# Patient Record
Sex: Female | Born: 1990 | Race: White | Hispanic: No | Marital: Married | Smoking: Former smoker
Health system: Southern US, Community
[De-identification: ages and names within clinical notes are randomized; demographics above are authoritative.]

## PROBLEM LIST (undated history)

## (undated) DIAGNOSIS — F419 Anxiety disorder, unspecified: Secondary | ICD-10-CM

## (undated) DIAGNOSIS — F32A Depression, unspecified: Secondary | ICD-10-CM

## (undated) DIAGNOSIS — R87629 Unspecified abnormal cytological findings in specimens from vagina: Secondary | ICD-10-CM

## (undated) DIAGNOSIS — F329 Major depressive disorder, single episode, unspecified: Secondary | ICD-10-CM

## (undated) HISTORY — PX: MOUTH SURGERY: SHX715

## (undated) HISTORY — DX: Depression, unspecified: F32.A

## (undated) HISTORY — DX: Anxiety disorder, unspecified: F41.9

## (undated) HISTORY — DX: Major depressive disorder, single episode, unspecified: F32.9

## (undated) HISTORY — DX: Unspecified abnormal cytological findings in specimens from vagina: R87.629

---
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
## 2010-02-10 ENCOUNTER — Observation Stay: Payer: Self-pay | Admitting: Obstetrics and Gynecology

## 2010-02-16 ENCOUNTER — Observation Stay: Payer: Self-pay | Admitting: Obstetrics and Gynecology

## 2010-03-01 ENCOUNTER — Inpatient Hospital Stay: Payer: Self-pay | Admitting: Obstetrics and Gynecology

## 2014-03-08 ENCOUNTER — Encounter (HOSPITAL_COMMUNITY): Payer: Self-pay | Admitting: Emergency Medicine

## 2014-03-08 ENCOUNTER — Emergency Department (HOSPITAL_COMMUNITY): Payer: BC Managed Care – HMO

## 2014-03-08 ENCOUNTER — Emergency Department (HOSPITAL_COMMUNITY)
Admission: EM | Admit: 2014-03-08 | Discharge: 2014-03-08 | Disposition: A | Discharge status: Home / Self Care | Payer: BC Managed Care – HMO | Attending: Emergency Medicine | Admitting: Emergency Medicine

## 2014-03-08 DIAGNOSIS — R51 Headache: Secondary | ICD-10-CM | POA: Insufficient documentation

## 2014-03-08 DIAGNOSIS — R509 Fever, unspecified: Secondary | ICD-10-CM | POA: Diagnosis not present

## 2014-03-08 DIAGNOSIS — H9209 Otalgia, unspecified ear: Secondary | ICD-10-CM | POA: Insufficient documentation

## 2014-03-08 DIAGNOSIS — J029 Acute pharyngitis, unspecified: Secondary | ICD-10-CM | POA: Insufficient documentation

## 2014-03-08 LAB — CBC WITH DIFFERENTIAL/PLATELET
BASOS PCT: 0 % (ref 0–1)
Basophils Absolute: 0.1 10*3/uL (ref 0.0–0.1)
EOS ABS: 0 10*3/uL (ref 0.0–0.7)
Eosinophils Relative: 0 % (ref 0–5)
HCT: 39.9 % (ref 36.0–46.0)
Hemoglobin: 13.3 g/dL (ref 12.0–15.0)
Lymphocytes Relative: 11 % — ABNORMAL LOW (ref 12–46)
Lymphs Abs: 1.8 10*3/uL (ref 0.7–4.0)
MCH: 27.9 pg (ref 26.0–34.0)
MCHC: 33.3 g/dL (ref 30.0–36.0)
MCV: 83.8 fL (ref 78.0–100.0)
Monocytes Absolute: 1.5 10*3/uL — ABNORMAL HIGH (ref 0.1–1.0)
Monocytes Relative: 9 % (ref 3–12)
Neutro Abs: 12.9 10*3/uL — ABNORMAL HIGH (ref 1.7–7.7)
Neutrophils Relative %: 80 % — ABNORMAL HIGH (ref 43–77)
PLATELETS: 231 10*3/uL (ref 150–400)
RBC: 4.76 MIL/uL (ref 3.87–5.11)
RDW: 12.5 % (ref 11.5–15.5)
WBC: 16.3 10*3/uL — ABNORMAL HIGH (ref 4.0–10.5)

## 2014-03-08 LAB — RAPID STREP SCREEN (MED CTR MEBANE ONLY): STREPTOCOCCUS, GROUP A SCREEN (DIRECT): NEGATIVE

## 2014-03-08 MED ORDER — IBUPROFEN 800 MG PO TABS
800.0000 mg | ORAL_TABLET | Freq: Once | ORAL | Status: AC
  Administered 2014-03-08: 800 mg via ORAL
  Filled 2014-03-08: qty 1

## 2014-03-08 MED ORDER — ACETAMINOPHEN 325 MG PO TABS
650.0000 mg | ORAL_TABLET | Freq: Four times a day (QID) | ORAL | Status: DC | PRN
  Administered 2014-03-08: 650 mg via ORAL

## 2014-03-08 NOTE — ED Notes (Signed)
Pt states that she was feeling bad one week prior, but got better, only to have flu like symptoms for the past two days.  Pt states she have been having a fever at home with some relief from motrin, but little from tylenol.  Pt states she has been just sleeping all day

## 2014-03-08 NOTE — ED Notes (Addendum)
Pt reports having flu like symptoms for several days. Bodyaches, ear pain, chills, temp, sore throat, feeling lightheaded. Reports similar episode occurred last week but resolved. Mask on pt at triage.

## 2014-03-08 NOTE — Discharge Instructions (Signed)
Fever, Adult Make sure that you drink at least six 8 ounce glasses of water daily. Gatorade is also good to rehydrate. Take Tylenol every 4 hours as needed for discomfort or for temperature higher than 100.4 while awake. Call the wellness Center to get a primary care physician. Seen in urgent care Center or the wellness Center or you can call any of the numbers on the resource guide to be seen if not better in 3 or 4 days A fever is a higher than normal body temperature. In an adult, an oral temperature around 98.6 F (37 C) is considered normal. A temperature of 100.4 F (38 C) or higher is generally considered a fever. Mild or moderate fevers generally have no long-term effects and often do not require treatment. Extreme fever (greater than or equal to 106 F or 41.1 C) can cause seizures. The sweating that may occur with repeated or prolonged fever may cause dehydration. Elderly people can develop confusion during a fever. A measured temperature can vary with:  Age.  Time of day.  Method of measurement (mouth, underarm, rectal, or ear). The fever is confirmed by taking a temperature with a thermometer. Temperatures can be taken different ways. Some methods are accurate and some are not.  An oral temperature is used most commonly. Electronic thermometers are fast and accurate.  An ear temperature will only be accurate if the thermometer is positioned as recommended by the manufacturer.  A rectal temperature is accurate and done for those adults who have a condition where an oral temperature cannot be taken.  An underarm (axillary) temperature is not accurate and not recommended. Fever is a symptom, not a disease.  CAUSES   Infections commonly cause fever.  Some noninfectious causes for fever include:  Some arthritis conditions.  Some thyroid or adrenal gland conditions.  Some immune system conditions.  Some types of cancer.  A medicine reaction.  High doses of certain street  drugs such as methamphetamine.  Dehydration.  Exposure to high outside or room temperatures.  Occasionally, the source of a fever cannot be determined. This is sometimes called a "fever of unknown origin" (FUO).  Some situations may lead to a temporary rise in body temperature that may go away on its own. Examples are:  Childbirth.  Surgery.  Intense exercise. HOME CARE INSTRUCTIONS   Take appropriate medicines for fever. Follow dosing instructions carefully. If you use acetaminophen to reduce the fever, be careful to avoid taking other medicines that also contain acetaminophen. Do not take aspirin for a fever if you are younger than age 23. There is an association with Reye's syndrome. Reye's syndrome is a rare but potentially deadly disease.  If an infection is present and antibiotics have been prescribed, take them as directed. Finish them even if you start to feel better.  Rest as needed.  Maintain an adequate fluid intake. To prevent dehydration during an illness with prolonged or recurrent fever, you may need to drink extra fluid.Drink enough fluids to keep your urine clear or pale yellow.  Sponging or bathing with room temperature water may help reduce body temperature. Do not use ice water or alcohol sponge baths.  Dress comfortably, but do not over-bundle. SEEK MEDICAL CARE IF:   You are unable to keep fluids down.  You develop vomiting or diarrhea.  You are not feeling at least partly better after 3 days.  You develop new symptoms or problems. SEEK IMMEDIATE MEDICAL CARE IF:   You have shortness of breath  or trouble breathing.  You develop excessive weakness.  You are dizzy or you faint.  You are extremely thirsty or you are making little or no urine.  You develop new pain that was not there before (such as in the head, neck, chest, back, or abdomen).  You have persistant vomiting and diarrhea for more than 1 to 2 days.  You develop a stiff neck or your  eyes become sensitive to light.  You develop a skin rash.  You have a fever or persistent symptoms for more than 2 to 3 days.  You have a fever and your symptoms suddenly get worse. MAKE SURE YOU:   Understand these instructions.  Will watch your condition.  Will get help right away if you are not doing well or get worse. Document Released: 02/12/2001 Document Revised: 11/11/2011 Document Reviewed: 06/20/2011 [facility] Patient Information 2015 [place], [place]. This information is not intended to replace advice given to you by your health care provider. Make sure you discuss any questions you have with your health care provider.  Emergency Department Resource Guide 1) Find a Doctor and Pay Out of Pocket Although you won't have to find out who is covered by your insurance plan, it is a good idea to ask around and get recommendations. You will then need to call the office and see if the doctor you have chosen will accept you as a new patient and what types of options they offer for patients who are self-pay. Some doctors offer discounts or will set up payment plans for their patients who do not have insurance, but you will need to ask so you aren't surprised when you get to your appointment.  2) Contact Your Local Health Department Not all health departments have doctors that can see patients for sick visits, but many do, so it is worth a call to see if yours does. If you don't know where your local health department is, you can check in your phone book. The CDC also has a tool to help you locate your state's health department, and many state websites also have listings of all of their local health departments.  3) Find a Walk-in Clinic If your illness is not likely to be very severe or complicated, you may want to try a walk in clinic. These are popping up all over the country in pharmacies, drugstores, and shopping centers. They're usually staffed by nurse practitioners or physician assistants  that have been trained to treat common illnesses and complaints. They're usually fairly quick and inexpensive. However, if you have serious medical issues or chronic medical problems, these are probably not your best option.  No Primary Care Doctor: - Call Health Connect at  [phone] - they can help you locate a primary care doctor that  accepts your insurance, provides certain services, etc. - Physician Referral Service- [phone]  Chronic Pain Problems: Organization         Address  Phone   Notes  [facility]  [phone] Patients need to be referred by their primary care doctor.   Medication Assistance: Organization         Address  Phone   Notes  [facility] [address] [place] [place], [place] [address] [phone] --Must be a resident of [facility] -- Must have NO insurance coverage whatsoever (no Medicaid/ Medicare, etc.) -- The pt. MUST have a primary care doctor that directs their care regularly and follows them in the community  Med[facility]ist  [phone]   [employer]  [phone]    Agencies that provide inexpensive medical care: Organization         Address  Phone   Notes  [facility]  [phone]   [facility]    [phone]   [facility] [address] [place], [place] [address] [phone]   [facility] [address] [place], [place] [phone]   Planned Parenthood    [phone]   [facility]    [phone]   Community Health and [facility]  [address], [place] Phone:  [phone], Fax:  [phone] Hours of Operation:  9 am - 6 pm, M-F.  Also accepts Medicaid/Medicare and self-pay.  [facility]  [address], [place] Phone: [phone], Fax: [phone]. Hours of Operation:  8:30 am - 5:30 pm, M-F.  Also accepts Medicaid  and self-pay.  [facility] [place] [address] [place] Phone: [phone]   [facility] [address] Natasha Bence [place], [place] [phone], Ext. [phone] Mondays & Thursdays: 7-9 AM.  First 15 patients are seen on a first come, first serve basis.    Medicaid-accepting [facility] Providers:  Organization         Address  Phone   Notes  [facility] [address], [place] [phone] Also accepts self-pay patients.  [facility] [address] Laurell Josephs [place], [place]  [phone]   [facility] [address], [place] [phone]   [facility] [address] [place] [phone]   Renaye Rakers [address], [place]   [phone] Only accepts [place] Access [place] patients after they have their name applied to their card.   Self-Pay (no insurance) in [facility]:  Organization         Address  Phone   Notes  Sickle Cell Patients, [facility] [address] [place], [place] [phone]   [facility] [address] [place], [place] [phone]   [facility] [place]  [address], [place] [phone]   [facility]/Dr. Osei-Bonsu  [address] [place] or [address], [place] [phone] Phone number for both [place] and [place] locations is the same.  Urgent Medical and [facility] [address] [place] [phone]   [facility] [address] [place] or [address] [phone]   [facility] [address], [place] [phone], phone; [phone], fax Sees patients 1st and 3rd Saturday of every month.  Must not qualify for public or private insurance (i.e. Medicaid, Medicare, [place] Health Choice, Veterans' Benefits)  Household income  should be no more than 200% of the poverty level The clinic cannot treat you if you are pregnant or think you are pregnant  Sexually transmitted diseases are not treated at the clinic.    Dental Care: Organization         Address  Phone  Notes  [facility] Department of [facility] [address] [place], [place] [phone] Accepts children up to age 79 who are enrolled in [place] or [place] Health Choice;  pregnant women with a Medicaid card; and children who have applied for Medicaid or [place] Health Choice, but were declined, whose parents can pay a reduced fee at time of service.  [facility] [place]  [address], [place] [phone] Accepts children up to age 23 who are enrolled in [place] or [place] Health Choice; pregnant women with a Medicaid card; and children who have applied for Medicaid or [place] Health Choice, but were declined, whose parents can pay a reduced fee at time of service.  [facility]  [address], [place] [phone] Patients are seen by appointment only. Walk-ins are not accepted. [facility] will see patients 23 years of age and older. Monday - Tuesday (8am-5pm) Most Wednesdays (8:30-5pm) $30 per visit, cash only  [facility]  [address], [facility] [place] [phone] Patients are seen by appointment only. Walk-ins are not accepted. [facility] will see patients 23 years of age and older. One Wednesday Evening (Monthly: Volunteer Based).  $30 per visit, cash only  [employer] [facility] [facility]  [phone] for adults; Children under age 134, call Graduate Pediatric Dentistry at [phone]. Children aged 404-14, please call [phone] to request a pediatric application.  Dental services are provided in all areas of dental care including fillings, crowns and bridges, complete and partial dentures, implants, gum treatment,  root canals, and extractions. Preventive care is also provided. Treatment is provided to both adults and children. Patients are selected via a lottery and there is often a waiting list.   [facility] [address], [place]  [phone] [email or web address]   [facility] [address], [place], [place] [phone], Ext. [phone] Second and Fourth Thursday of each month, opens at 6:30 AM; Clinic ends at 9 AM.  Patients are seen on a first-come first-served basis, and a limited number are seen during each clinic.   [facility]  [address], [place], [place] [phone]   Eligibility Requirements You must have lived in [place], [place], or [place] counties for at least the last three months.   You cannot be eligible for state or federal sponsored [place] Cityhealthcare insurance, including CIGNAVeterans Administration, [place]icaid, or [employer]icare.   You generally cannot be eligible for healthcare insurance through your employer.    How to apply: Eligibility screenings are held every Tuesday and Wednesday afternoon from 1:00 pm until 4:00 pm. You do not need an appointment for the interview!  [facility] [address], [place], [place] [phone]   [facility]  [phone]   [facility]  [phone]   [place]  [phone]    Behavioral Health Resources in the Community: Intensive Outpatient Programs Organization         Address  Phone  Notes  [facility] [address], [place], [place] [phone]   [facility] [address], [place], [place] [phone]   [facility] [address], [place], [place]  [phone]   [facility] [address],  [place], [place] [phone] or [phone]   Substance Abuse Resources Organization         Address  Phone  Notes  [facility]   [phone]   [facility]  [phone]   [facility]  [phone]  Floydene Flock  [phone]   Residential & Outpatient Substance Abuse Program  [phone]   Psychological Services Organization         Address  Phone  Notes  [facility]  [phone]   [facility]  [phone]   [facility] [address], [place] [phone] or [phone]    Mobile Crisis Teams Organization         Address  Phone  Notes  [facility], Mobile Crisis Care Unit  [phone]   [facility]  [address] [place], [place] [phone]   Doristine Locks [address] [place] [phone]    Self-Help/Support Groups Organization         Address  Phone             Notes  [facility] [place] - variety of support groups  [phone]- I7437963 Call for more information  Narcotics Anonymous (NA), [facility] [address], [employer] [place]  2 meetings at this location   Statistician         Address  Phone  Notes  [facility] [address],    [place]  [phone]   [facility]  [address], [place] [address], [place], [place] [phone]   [facility] [address] [place], [place] [phone] Admissions: 8am-3pm M-F  [facility] [address],    [place], [place] [phone]   [facility] [address] [place], [place], [place] [phone]   [facility] [address]  [place], [place] [phone]   [facility] - Intensive Outpatient [address] Laurell Josephs [address], [place], [place] [phone]   [facility] ([facility] [address]  [place], [place] [phone] or [phone]   [facility] ([facility]) [address], [place], [place] [phone] Accepts Medicaid  [facility] [address]  [place] [phone] Substance Abuse/Addiction Treatment   [facility] Organization         Address  Phone  Notes  [facility]  [phone]   Angie Fava, PhD [address] Ervin Knack [place], [place]   [phone] or [phone]   [facility]   [address] [place], [place] [phone]   [facility] [address], [place], [place] [phone] Insurance/Medicaid/sponsorship through [facility] [address]                                    [place], [place] [phone] Therapy/tele-psych/case  [facility] [address], [place] [phone]    Dr. Lolly Mustache  [phone]   [facility]  [employer] [facility] Dept. 1) [address] [place] 2) [address], [place] 3)  [address], [place] [phone]  [phone]   [facility] Child Abuse Hotline [phone])002-3970 or [phone] (After Hours)

## 2014-03-08 NOTE — ED Provider Notes (Signed)
CSN: [id number]     Arrival date & time 03/08/14  1458 History   First MD Initiated Contact with Patient 03/08/14 1947     Chief Complaint  Patient presents with  . Fever  . Influenza     (Consider location/radiation/quality/duration/timing/severity/associated sxs/prior Treatment) Patient is a 23 y.o. female presenting with fever and flu symptoms.  Fever Associated symptoms: cough, ear pain, headaches and sore throat   Influenza Presenting symptoms: cough, fever, headache and sore throat   Associated symptoms: ear pain    Complaint of headache right ear pain cough sore throat diffuse myalgias and fever onset 2 days ago. Treated with Motrin at home but transient relief. Pain is worse with swallowing. Moderate present. No other associated symptoms. No vomiting or diarrhea no bowel pain No past medical history on file. No past surgical history on file. past medical history negative surgical history negative No family history on file. History  Substance Use Topics  . Smoking status: Not on file  . Smokeless tobacco: Not on file  . Alcohol Use: No   no tobacco occasional alcohol no illicit drug use OB History   Grav Para Term Preterm Abortions TAB SAB Ect Mult Living                 Review of Systems  Constitutional: Positive for fever.  HENT: Positive for ear pain and sore throat.   Respiratory: Positive for cough.   Neurological: Positive for headaches.      Allergies  Review of patient's allergies indicates no known allergies.  Home Medications   Prior to Admission medications   Not on File   BP 98/67  Pulse 117  Temp(Src) 102.8 F (39.3 C) (Oral)  Resp 22  Ht 5\' 1"  (1.549 m)  Wt 135 lb (61.236 kg)  BMI 25.52 kg/m2  SpO2 100%  LMP 03/01/2014 Physical Exam  Nursing note and vitals reviewed. Constitutional: She appears well-developed and well-nourished. No distress.  HENT:  Head: Normocephalic and atraumatic.  Right Ear: External ear normal.  Left Ear:  External ear normal.  Mouth/Throat: No oropharyngeal exudate.  Oropharynx reddened, uvula midline  Eyes: Conjunctivae are normal. Pupils are equal, round, and reactive to light.  Neck: Neck supple. No tracheal deviation present. No thyromegaly present.  Cardiovascular: Normal rate and regular rhythm.   No murmur heard. Pulmonary/Chest: Effort normal and breath sounds normal.  Abdominal: Soft. Bowel sounds are normal. She exhibits no distension. There is no tenderness.  Musculoskeletal: Normal range of motion. She exhibits no edema and no tenderness.  Neurological: She is alert. Coordination normal.  Skin: Skin is warm and dry. No rash noted.  Psychiatric: She has a normal mood and affect.    ED Course  Procedures (including critical care time) Labs Review Labs Reviewed  CBC WITH DIFFERENTIAL - Abnormal; Notable for the following:    WBC 16.3 (*)    Neutrophils Relative % 80 (*)    Neutro Abs 12.9 (*)    Lymphocytes Relative 11 (*)    Monocytes Absolute 1.5 (*)    All other components within normal limits  RAPID STREP SCREEN  LACTIC ACID, PLASMA  BASIC METABOLIC PANEL  HCG, SERUM, QUALITATIVE  MONONUCLEOSIS SCREEN    Imaging Review Dg Chest 2 View  03/08/2014   CLINICAL DATA:  Fever and chills.  EXAM: CHEST  2 VIEW  COMPARISON:  None.  FINDINGS: The cardiac silhouette, mediastinal and hilar contours are normal. The lungs are clear. No pleural effusion. The bony thorax  is intact.  IMPRESSION: Normal chest x-ray.   Electronically Signed   By: Loralie ChampagneMark  Gallerani M.D.   On: 03/08/2014 18:54     EKG Interpretation None       8:30 PM patient sitting in bed pexing appears in no distress. Chest x-ray viewed by me MDM  Strongly suspect viral illness. Final diagnoses:  None   And encourage oral hydration. Antipyretics. Referral to wellness Center or resource guide to get a primary care physician and to get rechecked if not better in 3 or 4 days     Doug SouSam Toniya Rozar, MD 03/08/14  2039

## 2014-03-10 LAB — CULTURE, GROUP A STREP

## 2018-03-29 ENCOUNTER — Encounter: Payer: Self-pay | Admitting: Emergency Medicine

## 2018-03-29 ENCOUNTER — Emergency Department
Admission: EM | Admit: 2018-03-29 | Discharge: 2018-03-29 | Disposition: A | Discharge status: Home / Self Care | Payer: BC Managed Care – PPO | Attending: Emergency Medicine | Admitting: Emergency Medicine

## 2018-03-29 ENCOUNTER — Emergency Department: Payer: BC Managed Care – PPO

## 2018-03-29 ENCOUNTER — Other Ambulatory Visit: Payer: Self-pay

## 2018-03-29 DIAGNOSIS — M25511 Pain in right shoulder: Secondary | ICD-10-CM | POA: Diagnosis present

## 2018-03-29 DIAGNOSIS — M7581 Other shoulder lesions, right shoulder: Secondary | ICD-10-CM | POA: Diagnosis not present

## 2018-03-29 MED ORDER — TRAMADOL HCL 50 MG PO TABS: 50.0000 mg | ORAL_TABLET | Freq: Four times a day (QID) | ORAL | 0 refills | Status: DC | PRN

## 2018-03-29 MED ORDER — MELOXICAM 15 MG PO TABS: 15.0000 mg | ORAL_TABLET | Freq: Every day | ORAL | 0 refills | Status: DC

## 2018-03-29 NOTE — ED Triage Notes (Signed)
C/O right shoulder pain x 3-4 days.  States has had intermittent pain to shoulder for the past several years.  States was throwing a football yesterday.

## 2018-03-29 NOTE — ED Notes (Signed)
Pt c/o right shoulder pain, states has hx of shoulder pain in same shoulder, states yesterday she over threw a football which may have worsened the pain. Pt states the pain radiates down to right elbow and to right side of the neck.

## 2018-03-29 NOTE — ED Provider Notes (Signed)
[facility] Emergency Department Provider Note ____________________________________________  Time seen: Approximately 11:17 AM  I have reviewed the triage vital signs and the nursing notes.   HISTORY  Chief Complaint Shoulder Pain    HPI Katie Vargas is a 27 y.o. female who presents to the emergency department for evaluation and treatment of right shoulder pain.  Patient states that she has a long-standing history of right shoulder pain that seems to be worse with activities such as bowling and playing all.  She is unsure what caused the pain to start 3 or 4 days ago, but she was throwing a football last night and overextended her shoulder.  Pain is worse today than it has ever been.  She has not attempted any alleviating measures prior to arrival.  History reviewed. No pertinent past medical history.  There are no active problems to display for this patient.   History reviewed. No pertinent surgical history.  Prior to Admission medications   Medication Sig Start Date End Date Taking? Authorizing Provider  meloxicam (MOBIC) 15 MG tablet Take 1 tablet (15 mg total) by mouth daily. 03/29/18   Lielle Vandervort, Rulon Eisenmenger B, FNP  traMADol (ULTRAM) 50 MG tablet Take 1 tablet (50 mg total) by mouth every 6 (six) hours as needed. 03/29/18   Chinita Pester, FNP    Allergies Patient has no known allergies.  No family history on file.  Social History Social History   Tobacco Use  . Smoking status: Never Smoker  . Smokeless tobacco: Never Used  Substance Use Topics  . Alcohol use: No  . Drug use: No    Review of Systems Constitutional: Negative for fever. Cardiovascular: Negative for chest pain. Respiratory: Negative for shortness of breath. Musculoskeletal: Positive for right shoulder pain. Skin: Negative for open wound or lesion. Neurological: Positive for radiculopathy in the right upper arm to elbow  ____________________________________________   PHYSICAL  EXAM:  VITAL SIGNS: ED Triage Vitals  Enc Vitals Group     BP 03/29/18 1107 135/84     Pulse Rate 03/29/18 1107 86     Resp 03/29/18 1107 18     Temp 03/29/18 1107 98.3 F (36.8 C)     Temp Source 03/29/18 1107 Oral     SpO2 03/29/18 1107 98 %     Weight 03/29/18 1101 140 lb (63.5 kg)     Height 03/29/18 1101 5\' 1"  (1.549 m)     Head Circumference --      Peak Flow --      Pain Score 03/29/18 1101 8     Pain Loc --      Pain Edu? --      Excl. in GC? --     Constitutional: Alert and oriented. Well appearing and in no acute distress. Eyes: Conjunctivae are clear without discharge or drainage Head: Atraumatic Neck: Supple.  No focal midline tenderness. Respiratory: No cough. Respirations are even and unlabored. Musculoskeletal: Pain in the right anterior shoulder with abduction, external rotation, painful arch after 90* without arm drop. Neurologic: Motor and sensation of the right upper extremity is intact.  Skin: Intact over right upper extremity without rash or lesion.  Psychiatric: Affect and behavior are appropriate.  ____________________________________________   LABS (all labs ordered are listed, but only abnormal results are displayed)  Labs Reviewed - No data to display ____________________________________________  RADIOLOGY  Calcific tendinitis of the right rotator cuff.  ____________________________________________   PROCEDURES  Procedures  ____________________________________________   INITIAL IMPRESSION / ASSESSMENT  AND PLAN / ED COURSE  Katie Vargas is a 27 y.o. who presents to the emergency department for treatment and evaluation of right shoulder pain. X-ray shows a calcification on the rotator cuff. She will be treated with meloxicam and tramadol. She is to return to the ER for symptoms that change or worsen if she is unable to schedule an appointment with orthopedics or primary care.  Medications - No data to display  Pertinent labs & imaging  results that were available during my care of the patient were reviewed by me and considered in my medical decision making (see chart for details).  _________________________________________   FINAL CLINICAL IMPRESSION(S) / ED DIAGNOSES  Final diagnoses:  Rotator cuff tendonitis, right    ED Discharge Orders        Ordered    meloxicam (MOBIC) 15 MG tablet  Daily     03/29/18 1323    traMADol (ULTRAM) 50 MG tablet  Every 6 hours PRN     03/29/18 1323       If controlled substance prescribed during this visit, 12 month history viewed on the [facility] prior to issuing an initial prescription for Schedule II or III opiod.    Chinita Pesterriplett, Sam Wunschel B, FNP 03/29/18 1642    Sharyn CreamerQuale, Mark, MD 03/30/18 1310

## 2018-04-02 ENCOUNTER — Other Ambulatory Visit: Payer: Self-pay | Admitting: Sports Medicine

## 2018-04-02 DIAGNOSIS — M25511 Pain in right shoulder: Secondary | ICD-10-CM

## 2018-04-03 ENCOUNTER — Other Ambulatory Visit: Payer: Self-pay | Admitting: Sports Medicine

## 2018-04-03 DIAGNOSIS — M25511 Pain in right shoulder: Secondary | ICD-10-CM

## 2018-04-10 ENCOUNTER — Ambulatory Visit
Admission: RE | Admit: 2018-04-10 | Discharge: 2018-04-10 | Disposition: A | Discharge status: Home / Self Care | Payer: BC Managed Care – PPO | Source: Ambulatory Visit | Attending: Sports Medicine | Admitting: Sports Medicine

## 2018-04-10 DIAGNOSIS — M7591 Shoulder lesion, unspecified, right shoulder: Secondary | ICD-10-CM | POA: Diagnosis not present

## 2018-04-10 DIAGNOSIS — M25511 Pain in right shoulder: Secondary | ICD-10-CM

## 2018-04-10 DIAGNOSIS — M7551 Bursitis of right shoulder: Secondary | ICD-10-CM | POA: Diagnosis not present

## 2018-11-04 ENCOUNTER — Encounter: Payer: Self-pay | Admitting: Adult Health

## 2018-11-04 ENCOUNTER — Ambulatory Visit: Payer: BC Managed Care – PPO | Admitting: Adult Health

## 2018-11-04 VITALS — BP 122/81 | HR 96 | Ht 61.5 in | Wt 198.0 lb

## 2018-11-04 DIAGNOSIS — F32A Depression, unspecified: Secondary | ICD-10-CM | POA: Insufficient documentation

## 2018-11-04 DIAGNOSIS — Z3009 Encounter for other general counseling and advice on contraception: Secondary | ICD-10-CM | POA: Diagnosis not present

## 2018-11-04 DIAGNOSIS — M7989 Other specified soft tissue disorders: Secondary | ICD-10-CM | POA: Insufficient documentation

## 2018-11-04 DIAGNOSIS — Z3202 Encounter for pregnancy test, result negative: Secondary | ICD-10-CM | POA: Insufficient documentation

## 2018-11-04 DIAGNOSIS — F419 Anxiety disorder, unspecified: Secondary | ICD-10-CM | POA: Diagnosis not present

## 2018-11-04 DIAGNOSIS — R5383 Other fatigue: Secondary | ICD-10-CM | POA: Insufficient documentation

## 2018-11-04 DIAGNOSIS — Z113 Encounter for screening for infections with a predominantly sexual mode of transmission: Secondary | ICD-10-CM | POA: Insufficient documentation

## 2018-11-04 DIAGNOSIS — Z13220 Encounter for screening for lipoid disorders: Secondary | ICD-10-CM | POA: Insufficient documentation

## 2018-11-04 DIAGNOSIS — Z8742 Personal history of other diseases of the female genital tract: Secondary | ICD-10-CM

## 2018-11-04 DIAGNOSIS — F329 Major depressive disorder, single episode, unspecified: Secondary | ICD-10-CM

## 2018-11-04 LAB — POCT URINE PREGNANCY: Preg Test, Ur: NEGATIVE

## 2018-11-04 MED ORDER — PAROXETINE HCL 10 MG PO TABS: 10.0000 mg | ORAL_TABLET | Freq: Every day | ORAL | 6 refills | Status: DC

## 2018-11-04 MED ORDER — HYDROCHLOROTHIAZIDE 12.5 MG PO CAPS: 12.5000 mg | ORAL_CAPSULE | Freq: Every day | ORAL | 6 refills | Status: DC

## 2018-11-04 NOTE — Progress Notes (Signed)
Patient ID: Katie Vargas, female   DOB: Mar 22, 1991, 28 y.o.   MRN: [id number] History of Present Illness: Katie Vargas is a 28 year old white female, legally separated, G1P1 in as new pt, to discuss anxiety and IUD. 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 She had tooth pulled today.  PCP is Dr Toya Smothers.    Current Medications, Allergies, Past Medical History, Past Surgical History, Family History and Social History were reviewed in [name].     Review of Systems: +anxiety Feels like retaining fluid, legs swell +weight gain, like 50 lbs in a year   Physical Exam:BP 122/81 (BP Location: Left Arm, Patient Position: Sitting, Cuff Size: Large)   Pulse 96   Ht 5' 1.5" (1.562 m)   Wt 198 lb (89.8 kg)   LMP 10/17/2018 (Approximate)   BMI 36.81 kg/m   UPT negative, General:  Well developed, well nourished, no acute distress Skin:  Warm and dry Neck:  Midline trachea, normal thyroid, good ROM, no lymphadenopathy Lungs; Clear to auscultation bilaterally Cardiovascular: Regular rate and rhythm Extremities/musculoskeletal:  No varicosities noted, no clubbing or cyanosis,+swelling both lower extremities, R>L Psych:  No mood changes, alert and cooperative,seems happy,+anxiety Fall risk is low. PHQ 9 score is 9, denies being suicidal, and is open to meds, will try paxil 10 mg, she stopped lexapro, and has used prozac with no results. Was seen at [facility] in the past. Discussed IUDs, has had mirena in past. Has had abnormal pap in past too, will get pap and physical then schedule IUD insertion when on period. Will check labs and STDs today.    Impression: 1. Anxiety and depression   2. General counseling and advice for contraceptive management   3. Pregnancy examination or test, negative result   4. Screening examination for STD (sexually transmitted disease)   5. Swelling of both lower extremities   6. Screening cholesterol level   7. Fatigue, unspecified  type   8. History of abnormal cervical Pap smear       Plan: Continue to use condoms till pap results back  GC/CHL sent in urine Check CBC,CMP,TSH and lipids,HIV and RPR Meds ordered this encounter  Medications  . PARoxetine (PAXIL) 10 MG tablet    Sig: Take 1 tablet (10 mg total) by mouth daily.    Dispense:  30 tablet    Refill:  6    Order Specific Question:   Supervising Provider    Answer:   Despina Hidden, LUTHER H [[id number]]  . hydrochlorothiazide (MICROZIDE) 12.5 MG capsule    Sig: Take 1 capsule (12.5 mg total) by mouth daily.    Dispense:  30 capsule    Refill:  6    Order Specific Question:   Supervising Provider    Answer:   Lazaro Arms [[id number]]  Check insurance on IUD  Return in 3 weeks for pap and physical

## 2018-11-05 LAB — COMPREHENSIVE METABOLIC PANEL
ALBUMIN: 4.2 g/dL (ref 3.9–5.0)
ALT: 15 IU/L (ref 0–32)
AST: 21 IU/L (ref 0–40)
Albumin/Globulin Ratio: 1.2 (ref 1.2–2.2)
Alkaline Phosphatase: 64 IU/L (ref 39–117)
BUN/Creatinine Ratio: 12 (ref 9–23)
BUN: 8 mg/dL (ref 6–20)
Bilirubin Total: 0.2 mg/dL (ref 0.0–1.2)
CALCIUM: 9.3 mg/dL (ref 8.7–10.2)
CO2: 20 mmol/L (ref 20–29)
Chloride: 103 mmol/L (ref 96–106)
Creatinine, Ser: 0.68 mg/dL (ref 0.57–1.00)
GFR calc Af Amer: 139 mL/min/{1.73_m2} (ref >59)
GFR calc non Af Amer: 120 mL/min/{1.73_m2} (ref >59)
Globulin, Total: 3.4 g/dL (ref 1.5–4.5)
Glucose: 85 mg/dL (ref 65–99)
Potassium: 3.9 mmol/L (ref 3.5–5.2)
SODIUM: 141 mmol/L (ref 134–144)
Total Protein: 7.6 g/dL (ref 6.0–8.5)

## 2018-11-05 LAB — CBC
HEMOGLOBIN: 12.8 g/dL (ref 11.1–15.9)
Hematocrit: 39.5 % (ref 34.0–46.6)
MCH: 26.2 pg — ABNORMAL LOW (ref 26.6–33.0)
MCHC: 32.4 g/dL (ref 31.5–35.7)
MCV: 81 fL (ref 79–97)
Platelets: 367 10*3/uL (ref 150–450)
RBC: 4.88 x10E6/uL (ref 3.77–5.28)
RDW: 12.9 % (ref 11.7–15.4)
WBC: 12.4 10*3/uL — AB (ref 3.4–10.8)

## 2018-11-05 LAB — TSH: TSH: 1.34 u[IU]/mL (ref 0.450–4.500)

## 2018-11-05 LAB — LIPID PANEL
CHOLESTEROL TOTAL: 140 mg/dL (ref 100–199)
Chol/HDL Ratio: 3.3 ratio (ref 0.0–4.4)
HDL: 42 mg/dL (ref >39)
LDL Calculated: 85 mg/dL (ref 0–99)
TRIGLYCERIDES: 66 mg/dL (ref 0–149)
VLDL Cholesterol Cal: 13 mg/dL (ref 5–40)

## 2018-11-05 LAB — HIV ANTIBODY (ROUTINE TESTING W REFLEX): HIV Screen 4th Generation wRfx: NONREACTIVE

## 2018-11-05 LAB — RPR: RPR: NONREACTIVE

## 2018-11-06 LAB — GC/CHLAMYDIA PROBE AMP
CHLAMYDIA, DNA PROBE: NEGATIVE
NEISSERIA GONORRHOEAE BY PCR: NEGATIVE

## 2018-11-09 ENCOUNTER — Telehealth: Payer: Self-pay | Admitting: Adult Health

## 2018-11-09 NOTE — Telephone Encounter (Signed)
Pt aware that labs good, with negative HPV,RPR,GC/CHL too,

## 2018-11-25 ENCOUNTER — Telehealth: Payer: Self-pay | Admitting: *Deleted

## 2018-11-25 ENCOUNTER — Other Ambulatory Visit: Payer: BC Managed Care – PPO | Admitting: Adult Health

## 2018-11-25 NOTE — Telephone Encounter (Signed)
Spoke with pt asking if she was coming to today's appt. Pt has had headache and low fever. Pt states she had her wisdom teeth removed and she thinks it's from that. I advised she can reschedule today's appt for when she is feeling better. Pt voiced understanding. JSY

## 2018-12-17 IMAGING — MR MR SHOULDER*R* W/O CM
5 series · 40 of 40 positions shown · non-contrast
Comparison: Radiograph 03/29/2018

CLINICAL DATA: Right shoulder pain for 2 weeks.

EXAM:
MRI OF THE RIGHT SHOULDER WITHOUT CONTRAST
TECHNIQUE: Multiplanar, multisequence MR imaging of the shoulder was performed.
No intravenous contrast was administered.

[Series 3: T2 fat-sat · axial · 4.0mm · 0.59mm/px · z∈[-13,+75]mm · 10 of 21 slices shown (1 of 3)]
[im 1/21]
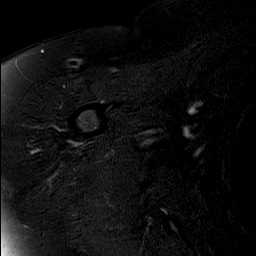
[im 3/21]
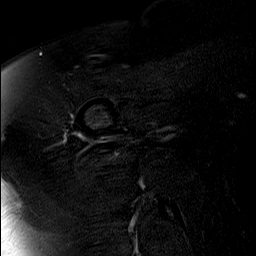
[im 5/21]
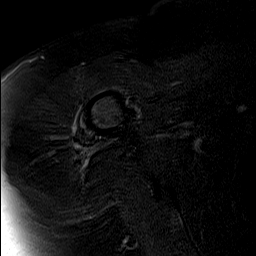
[im 7/21]
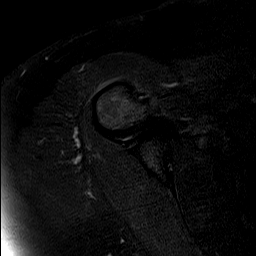
[im 9/21]
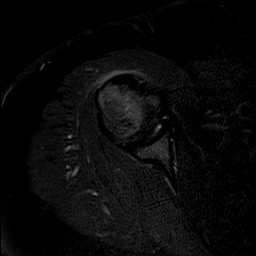
[im 12/21]
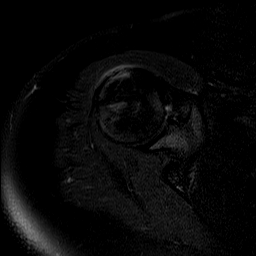
[im 14/21]
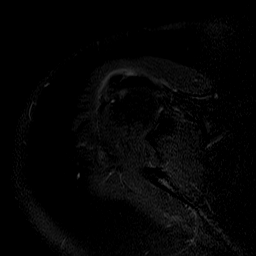
[im 16/21]
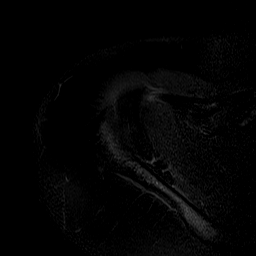
[im 18/21]
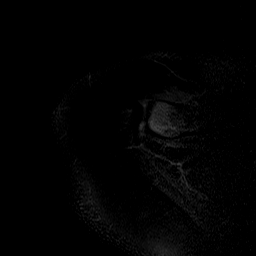
[im 21/21]
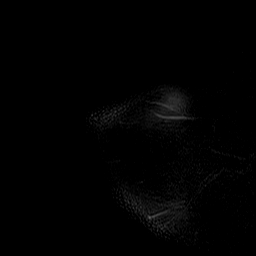

[Series 4: T2 fat-sat · oblique · 4.0mm · 0.59mm/px · 7 of 18 slices shown (2 of 3)]
[im 1/18]
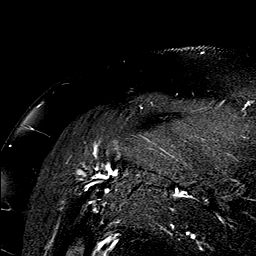
[im 3/18]
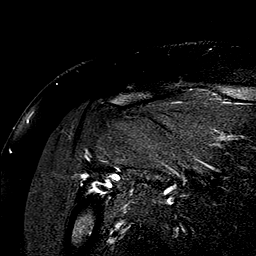
[im 6/18]
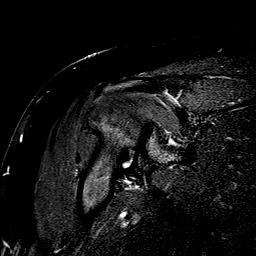
[im 9/18]
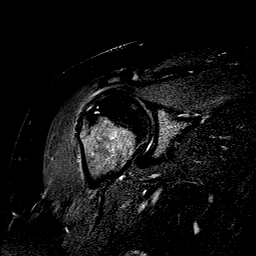
[im 12/18]
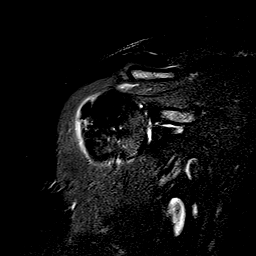
[im 15/18]
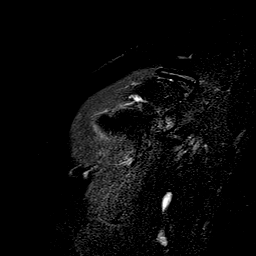
[im 18/18]
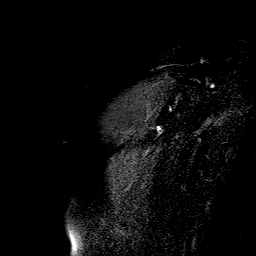

[Series 5: PD · oblique · 4.0mm · 0.59mm/px · 7 of 18 slices shown]
[im 1/18]
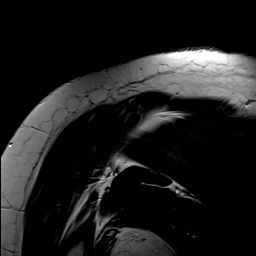
[im 3/18]
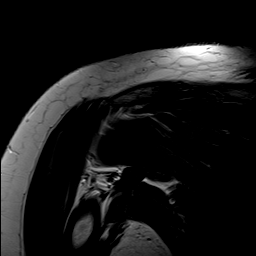
[im 6/18]
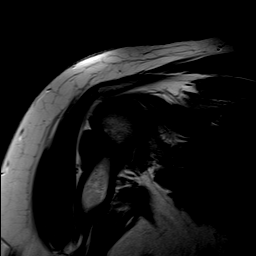
[im 9/18]
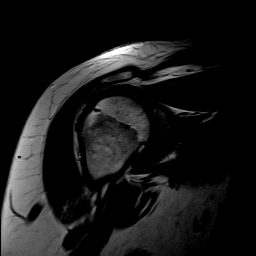
[im 12/18]
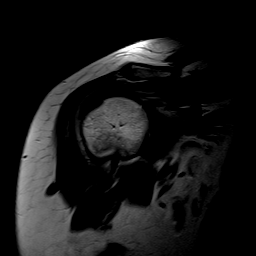
[im 15/18]
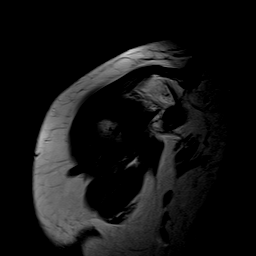
[im 18/18]
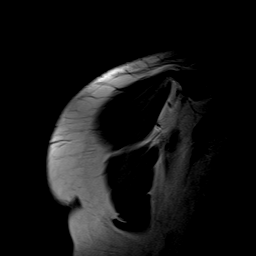

[Series 6: T1 · oblique · 4.0mm · 0.59mm/px · 8 of 20 slices shown]
[im 1/20]
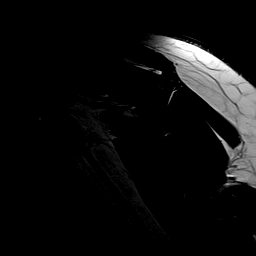
[im 3/20]
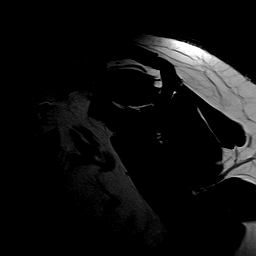
[im 6/20]
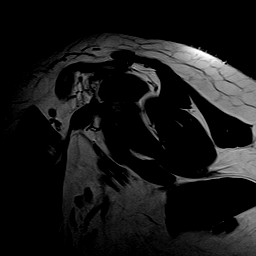
[im 9/20]
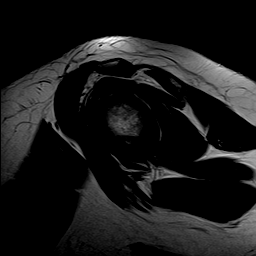
[im 11/20]
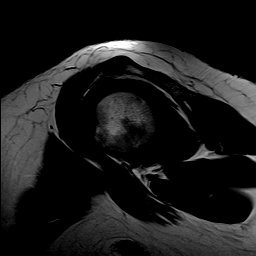
[im 14/20]
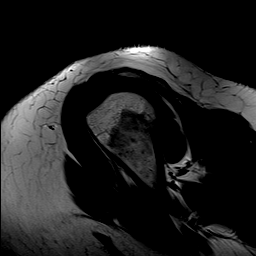
[im 17/20]
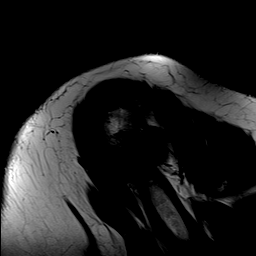
[im 20/20]
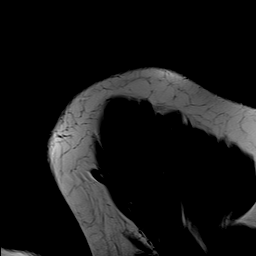

[Series 7: T2 fat-sat · oblique · 4.0mm · 0.59mm/px · 8 of 20 slices shown (3 of 3)]
[im 1/20]
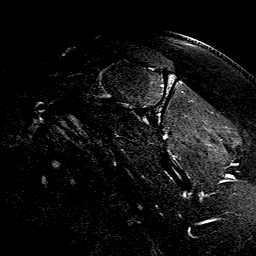
[im 3/20]
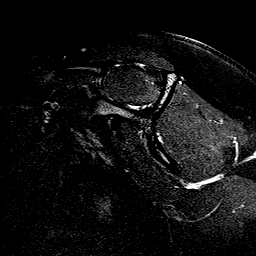
[im 6/20]
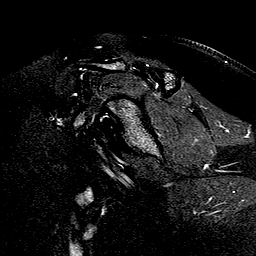
[im 9/20]
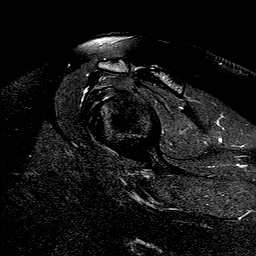
[im 11/20]
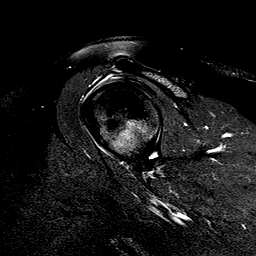
[im 14/20]
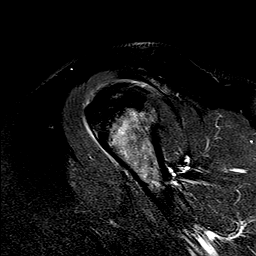
[im 17/20]
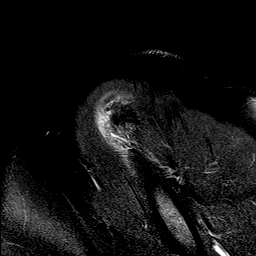
[im 20/20]
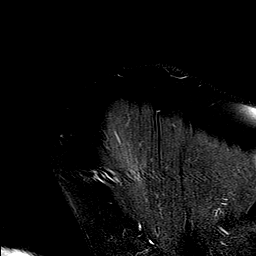

[40 of 40 positions shown; findings below may reference images not displayed]

FINDINGS: Rotator cuff: Mild rotator cuff tendinopathy/tendinosis with
probable small interstitial tears but no partial or full thickness
retracted tear.

Large area of calcification noted near the attachment region of the
supraspinatus tendon anteriorly. This measures 18 mm and length and
is more likely calcific bursitis than tendinitis. The infraspinatus
and subscapularis tendons are intact.

Muscles:  Normal

Biceps long head:  Intact

Acromioclavicular Joint: No degenerative changes type 1-2 acromion.
Mild lateral downsloping but no undersurface spurring.

Glenohumeral Joint: No degenerative changes or joint effusion.

Labrum:  No obvious labral tears.

Bones:  No acute bony findings.

Other: Moderate subacromial/subdeltoid bursitis.
IMPRESSION: 1. Mild rotator cuff tendinopathy/tendinosis but no partial or
full-thickness tear.
2. Large focus of calcific bursitis. Moderate subacromial/subdeltoid
bursitis.
3. Intact long head biceps tendon and glenoid labrum.
4. No significant MR findings for bony impingement.

## 2019-08-02 ENCOUNTER — Other Ambulatory Visit: Payer: Self-pay | Admitting: *Deleted

## 2019-08-02 MED ORDER — PAROXETINE HCL 10 MG PO TABS: 10.0000 mg | ORAL_TABLET | Freq: Every day | ORAL | 3 refills | Status: DC

## 2019-08-02 MED ORDER — HYDROCHLOROTHIAZIDE 12.5 MG PO CAPS: 12.5000 mg | ORAL_CAPSULE | Freq: Every day | ORAL | 6 refills | Status: DC

## 2019-10-13 ENCOUNTER — Ambulatory Visit: Payer: BC Managed Care – PPO

## 2019-10-13 ENCOUNTER — Ambulatory Visit: Payer: BC Managed Care – PPO | Attending: Internal Medicine

## 2019-10-13 ENCOUNTER — Other Ambulatory Visit: Payer: Self-pay

## 2019-10-13 DIAGNOSIS — Z20822 Contact with and (suspected) exposure to covid-19: Secondary | ICD-10-CM

## 2019-10-14 LAB — NOVEL CORONAVIRUS, NAA: SARS-CoV-2, NAA: DETECTED — AB

## 2019-10-15 ENCOUNTER — Telehealth: Payer: Self-pay | Admitting: Nurse Practitioner

## 2019-10-15 NOTE — Telephone Encounter (Signed)
Called to discuss with Scharlene Gloss about Covid symptoms and the use of bamlanivimab, a monoclonal antibody infusion for those with mild to moderate Covid symptoms and at a high risk of hospitalization.     Pt is qualified for this infusion at the [facility] infusion center due to co-morbid conditions and/or a member of an at-risk group (BMI 36.7).   Unable to reach patient. Voicemail left and Mychart message sent.   Patient Active Problem List   Diagnosis Date Noted  . History of abnormal cervical Pap smear 11/04/2018  . Fatigue 11/04/2018  . Screening cholesterol level 11/04/2018  . Swelling of both lower extremities 11/04/2018  . Screening examination for STD (sexually transmitted disease) 11/04/2018  . Pregnancy examination or test, negative result 11/04/2018  . General counseling and advice for contraceptive management 11/04/2018  . Anxiety and depression 11/04/2018    Willette Alma, AGPCNP-BC Pager: [phone] Amion: Thea Alken

## 2019-12-08 ENCOUNTER — Telehealth: Payer: Self-pay | Admitting: *Deleted

## 2019-12-08 MED ORDER — PAROXETINE HCL 10 MG PO TABS: 10.0000 mg | ORAL_TABLET | Freq: Every day | ORAL | 0 refills | Status: DC

## 2019-12-08 NOTE — Telephone Encounter (Signed)
Refill request was received from her pharmacy for Paroxetine.  Informed patient she needs to be seen to have med refilled.  Pt verbalized understanding and appt made for 4/22.  She states she will be out of medication before then and is requesting 1 refill on the medication. Please advise.

## 2019-12-08 NOTE — Telephone Encounter (Signed)
Refilled paxil

## 2019-12-08 NOTE — Addendum Note (Signed)
Addended by: Cyril Mourning A on: 12/08/2019 05:17 PM   Modules accepted: Orders

## 2019-12-23 ENCOUNTER — Ambulatory Visit (INDEPENDENT_AMBULATORY_CARE_PROVIDER_SITE_OTHER): Payer: BC Managed Care – PPO | Admitting: Adult Health

## 2019-12-23 ENCOUNTER — Encounter: Payer: Self-pay | Admitting: Adult Health

## 2019-12-23 ENCOUNTER — Other Ambulatory Visit: Payer: Self-pay

## 2019-12-23 ENCOUNTER — Other Ambulatory Visit (HOSPITAL_COMMUNITY)
Admission: RE | Admit: 2019-12-23 | Discharge: 2019-12-23 | Disposition: A | Discharge status: Home / Self Care | Payer: BC Managed Care – PPO | Source: Ambulatory Visit | Attending: Adult Health | Admitting: Adult Health

## 2019-12-23 VITALS — BP 114/80 | HR 87 | Ht 61.25 in | Wt 207.5 lb

## 2019-12-23 DIAGNOSIS — Z01419 Encounter for gynecological examination (general) (routine) without abnormal findings: Secondary | ICD-10-CM

## 2019-12-23 DIAGNOSIS — M7989 Other specified soft tissue disorders: Secondary | ICD-10-CM

## 2019-12-23 DIAGNOSIS — F329 Major depressive disorder, single episode, unspecified: Secondary | ICD-10-CM

## 2019-12-23 DIAGNOSIS — F419 Anxiety disorder, unspecified: Secondary | ICD-10-CM | POA: Diagnosis not present

## 2019-12-23 MED ORDER — PAROXETINE HCL 20 MG PO TABS: 20.0000 mg | ORAL_TABLET | Freq: Every day | ORAL | 6 refills | Status: DC

## 2019-12-23 NOTE — Progress Notes (Signed)
Patient ID: Katie Vargas, female   DOB: 1991/04/01, 29 y.o.   MRN: [id number] History of Present Illness: Katie Vargas is a  29 year old white female,engaged, G1P1 in for well woman gyn exam and pap.She thinks paxil helps. 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 No PCP   Current Medications, Allergies, Past Medical History, Past Surgical History, Family History and Social History were reviewed in [name].     Review of Systems:  Patient denies any headaches, hearing loss, fatigue, blurred vision,  chest pain, abdominal pain, problems with bowel movements, urination, or intercourse. No joint pain or Vargas swings. Has swelling in feet and legs even in am sometimes, takes 2 Microzide sometimes,has had for about 3 years now +anxiety and depression and thinks paxil helps Some shortness of breath with exertion  Physical Exam:BP 114/80 (BP Location: Left Arm, Patient Position: Sitting, Cuff Size: Large)   Pulse 87   Ht 5' 1.25" (1.556 m)   Wt 207 lb 8 oz (94.1 kg)   LMP 12/03/2019   BMI 38.89 kg/m  General:  Well developed, well nourished, no acute distress Skin:  Warm and dry Neck:  Midline trachea, normal thyroid, good ROM, no lymphadenopathy Lungs; Clear to auscultation bilaterally Breast:  No dominant palpable mass, retraction, or nipple discharge Cardiovascular: Regular rate and rhythm Abdomen:  Soft, non tender, no hepatosplenomegaly Pelvic:  External genitalia is normal in appearance, no lesions.  The vagina is normal in appearance. Urethra has no lesions or masses. The cervix is bulbous.Pap with GC/CHL and high risk HPV 16/18 genotyping performed.   Uterus is felt to be normal size, shape, and contour.  No adnexal masses or tenderness noted.Bladder is non tender, no masses felt. Extremities/musculoskeletal:  No swelling today or varicosities noted, no clubbing or cyanosis Psych:  No Vargas changes, alert and cooperative,seems happy AA is 0 Fall risk is low PHQ  9 score is 15, no SI, is on Paxil 10 mg will increase to 20 mg  Examination chaperoned by Katie Mood LPN  Impression and Plan: 1. Encounter for gynecological examination with Papanicolaou smear of cervix Pap sent Physical in 1 year Pap in 3 if normal   2. Anxiety and depression Increase Paxil to 20 mg Meds ordered this encounter  Medications  . PARoxetine (PAXIL) 20 MG tablet    Sig: Take 1 tablet (20 mg total) by mouth daily.    Dispense:  30 tablet    Refill:  6    Order Specific Question:   Supervising Provider    Answer:   Katie Vargas [[id number]]  Follow up in 3 months  3. Swelling of both lower extremities Continue Microzide, has refills Refer to cardiology

## 2019-12-28 LAB — CYTOLOGY - PAP
Chlamydia: NEGATIVE
Comment: NEGATIVE
Comment: NEGATIVE
Comment: NORMAL
High risk HPV: NEGATIVE
Neisseria Gonorrhea: NEGATIVE

## 2020-01-05 ENCOUNTER — Telehealth: Payer: Self-pay | Admitting: Cardiology

## 2020-01-05 ENCOUNTER — Ambulatory Visit (INDEPENDENT_AMBULATORY_CARE_PROVIDER_SITE_OTHER): Payer: BC Managed Care – PPO | Admitting: Cardiology

## 2020-01-05 ENCOUNTER — Other Ambulatory Visit: Payer: Self-pay

## 2020-01-05 ENCOUNTER — Encounter: Payer: Self-pay | Admitting: Cardiology

## 2020-01-05 VITALS — BP 104/78 | HR 88 | Ht 61.5 in | Wt 207.0 lb

## 2020-01-05 DIAGNOSIS — R600 Localized edema: Secondary | ICD-10-CM

## 2020-01-05 DIAGNOSIS — R0602 Shortness of breath: Secondary | ICD-10-CM | POA: Diagnosis not present

## 2020-01-05 DIAGNOSIS — Z136 Encounter for screening for cardiovascular disorders: Secondary | ICD-10-CM

## 2020-01-05 NOTE — Telephone Encounter (Signed)
  Precert needed for: Echo   Location: [facility]    Date: Feb 10, 2020

## 2020-01-05 NOTE — Progress Notes (Signed)
     Clinical Summary Ms. Katie Vargas is a 29 y.o.female seen as a new consult, referred by NP Valentina Lucks for LE edema  1. LE edema - ongoing for a few years. Up and down - has been HCTZ 12.5mg  prn swelling.  - bilateral swelling at times, some abdominal distension - some DOE she attributes weight. No orthopnea.  - sedentary lifestyle, some DOE with housework.  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 Anxiety   . Depression   . Vaginal Pap smear, abnormal      No Known Allergies   Current Outpatient Medications  Medication Sig Dispense Refill  . hydrochlorothiazide (MICROZIDE) 12.5 MG capsule Take 1 capsule (12.5 mg total) by mouth daily. (Patient taking differently: Take 12.5 mg by mouth. Every other day) 30 capsule 6  . PARoxetine (PAXIL) 20 MG tablet Take 1 tablet (20 mg total) by mouth daily. 30 tablet 6   No current facility-administered medications for this visit.     Past Surgical History:  Procedure Laterality Date  . CESAREAN SECTION  2011  . MOUTH SURGERY     teeth removed     No Known Allergies    Family History  Problem Relation Age of Onset  . Other Maternal Grandfather        heart issues  . Mental illness Father   . Mental illness Mother      Social History Ms. Katie Vargas reports that she has been smoking e-cigarettes. She has never used smokeless tobacco. Ms. Katie Vargas reports no history of alcohol use.   Review of Systems CONSTITUTIONAL: No weight loss, fever, chills, weakness or fatigue.  HEENT: Eyes: No visual loss, blurred vision, double vision or yellow sclerae.No hearing loss, sneezing, congestion, runny nose or sore throat.  SKIN: No rash or itching.  CARDIOVASCULAR: per hpi RESPIRATORY: No shortness of breath, cough or sputum.  GASTROINTESTINAL: No anorexia, nausea, vomiting or diarrhea. No abdominal pain or blood.  GENITOURINARY: No burning on urination, no  polyuria NEUROLOGICAL: No headache, dizziness, syncope, paralysis, ataxia, numbness or tingling in the extremities. No change in bowel or bladder control.  MUSCULOSKELETAL: No muscle, back pain, joint pain or stiffness.  LYMPHATICS: No enlarged nodes. No history of splenectomy.  PSYCHIATRIC: No history of depression or anxiety.  ENDOCRINOLOGIC: No reports of sweating, cold or heat intolerance. No polyuria or polydipsia.  Marland Kitchen   Physical Examination Today's Vitals   01/05/20 1459  BP: 104/78  Pulse: 88  SpO2: 98%  Weight: 207 lb (93.9 kg)  Height: 5' 1.5" (1.562 m)   Body mass index is 38.48 kg/m.  Gen: resting comfortably, no acute distress HEENT: no scleral icterus, pupils equal round and reactive, no palptable cervical adenopathy,  CV: RRR, no mrg, no jvd Resp: Clear to auscultation bilaterally GI: abdomen is soft, non-tender, non-distended, normal bowel sounds, no hepatosplenomegaly MSK: extremities are warm, no edema.  Skin: warm, no rash Neuro:  no focal deficits Psych: appropriate affect     Assessment and Plan  1. LE edema/SOB - unclear cause. Reports some DOE as well - will obtain echo to evaluate for any underlying cardiac dysfunction - reports controlled currently with just prn HCTZ, continue - if benign echo would not plan any further workup.       Antoine Poche, M.D

## 2020-01-05 NOTE — Patient Instructions (Signed)
Your physician recommends that you schedule a follow-up appointment in: PENDING WITH DR BRANCH  Your physician recommends that you continue on your current medications as directed. Please refer to the Current Medication list given to you today.  Your physician has requested that you have an echocardiogram. Echocardiography is a painless test that uses sound waves to create images of your heart. It provides your doctor with information about the size and shape of your heart and how well your heart's chambers and valves are working. This procedure takes approximately one hour. There are no restrictions for this procedure.  Thank you for choosing [facility]!!    

## 2020-02-01 ENCOUNTER — Other Ambulatory Visit: Payer: BC Managed Care – PPO

## 2020-02-10 ENCOUNTER — Other Ambulatory Visit: Payer: BC Managed Care – PPO

## 2020-03-08 ENCOUNTER — Telehealth: Payer: Self-pay | Admitting: Adult Health

## 2020-03-08 MED ORDER — PAROXETINE HCL 20 MG PO TABS: 20.0000 mg | ORAL_TABLET | Freq: Every day | ORAL | 0 refills | Status: DC

## 2020-03-08 MED ORDER — HYDROCHLOROTHIAZIDE 12.5 MG PO CAPS: 12.5000 mg | ORAL_CAPSULE | Freq: Every day | ORAL | 0 refills | Status: DC

## 2020-03-08 NOTE — Telephone Encounter (Signed)
Sent rx for paxil and microzide 4 days to [facility] in [place]

## 2020-03-08 NOTE — Telephone Encounter (Signed)
Telephoned patient and advised insurance may not pay. Patient is aware.

## 2020-03-08 NOTE — Addendum Note (Signed)
Addended by: Cyril Mourning A on: 03/08/2020 02:35 PM   Modules accepted: Orders

## 2020-03-08 NOTE — Telephone Encounter (Signed)
Patient is travelling. Would like meds sent to pharmacy [facility] address [address] [place], [place] [address] would like four days worth. Patient left meds at home. microzide and paxil is the two medications that she left before leaving on vacation.

## 2020-03-23 ENCOUNTER — Ambulatory Visit: Payer: BC Managed Care – PPO | Admitting: Adult Health

## 2020-03-24 ENCOUNTER — Other Ambulatory Visit: Payer: Self-pay

## 2020-03-24 ENCOUNTER — Encounter: Payer: Self-pay | Admitting: Adult Health

## 2020-03-24 ENCOUNTER — Ambulatory Visit (INDEPENDENT_AMBULATORY_CARE_PROVIDER_SITE_OTHER): Payer: BC Managed Care – PPO | Admitting: Adult Health

## 2020-03-24 VITALS — BP 112/77 | HR 84 | Ht 61.25 in | Wt 206.0 lb

## 2020-03-24 DIAGNOSIS — Z3202 Encounter for pregnancy test, result negative: Secondary | ICD-10-CM | POA: Diagnosis not present

## 2020-03-24 DIAGNOSIS — N926 Irregular menstruation, unspecified: Secondary | ICD-10-CM | POA: Diagnosis not present

## 2020-03-24 LAB — POCT URINE PREGNANCY: Preg Test, Ur: NEGATIVE

## 2020-03-24 MED ORDER — PAROXETINE HCL 10 MG PO TABS
10.0000 mg | ORAL_TABLET | Freq: Every day | ORAL | 6 refills | Status: DC
Start: 2020-03-24 — End: 2020-11-24

## 2020-03-24 MED ORDER — MEDROXYPROGESTERONE ACETATE 10 MG PO TABS: 10.0000 mg | ORAL_TABLET | Freq: Every day | ORAL | 0 refills | Status: DC

## 2020-03-24 NOTE — Progress Notes (Signed)
  Subjective:     Patient ID: Katie Vargas, female   DOB: 04-11-1991, 29 y.o.   MRN: [id number]  HPI Katie Vargas is a 29 year old white female, engaged, G1P1 in complaining of no period since April 2, about time started 20 mg of paxil.  She has seen cardiologist for swelling in LE but missed echo appt.  PCP is dr Toya Smothers.   Review of Systems No period since 12/03/19 She decreased paxil to 10 mg, felt crazy if missed a pill on 20 mg  Reviewed past medical,surgical, social and family history. Reviewed medications and allergies.     Objective:   Physical Exam BP 112/77 (BP Location: Left Arm, Patient Position: Sitting, Cuff Size: Normal)   Pulse 84   Ht 5' 1.25" (1.556 m)   Wt (!) 206 lb (93.4 kg)   LMP 12/03/2019 (Exact Date)   BMI 38.61 kg/m UPT is negative Skin warm and dry.  Lungs: clear to ausculation bilaterally. Cardiovascular: regular rate and rhythm.    Assessment:     1. Pregnancy examination or test, negative result  2. Missed periods Will rx provera to see if can get withdrawal bleed     Plan:    Call to get echo rescheduled Return in 3 weeks for follow up can be telehealth visit

## 2020-04-12 ENCOUNTER — Other Ambulatory Visit: Payer: BC Managed Care – PPO

## 2020-04-17 ENCOUNTER — Telehealth: Payer: BC Managed Care – PPO | Admitting: Adult Health

## 2020-04-20 ENCOUNTER — Ambulatory Visit (INDEPENDENT_AMBULATORY_CARE_PROVIDER_SITE_OTHER): Payer: BC Managed Care – PPO

## 2020-04-20 DIAGNOSIS — R0602 Shortness of breath: Secondary | ICD-10-CM

## 2020-04-20 LAB — ECHOCARDIOGRAM COMPLETE
Area-P 1/2: 4.71 cm2
Calc EF: 70.1 %
S' Lateral: 2.33 cm
Single Plane A2C EF: 68.1 %
Single Plane A4C EF: 70.2 %

## 2020-04-28 ENCOUNTER — Telehealth: Payer: Self-pay | Admitting: *Deleted

## 2020-04-28 NOTE — Telephone Encounter (Signed)
-----   Message from Antoine Poche, MD sent at 04/26/2020  1:00 PM EDT ----- Normal echo, her heart function is normal. No further cardiac testing or follow up is indicated.  J BrancH MD

## 2020-04-28 NOTE — Telephone Encounter (Signed)
Pt has been notified of echo results by phone with verbal understanding. Pt aware no further cardiac work or follow up is indicated per Dr. Wyline Mood. Pt thanked me for the call. The patient has been notified of the result and verbalized understanding. All questions (if any) were answered. Danielle Rankin, CMA 04/27/2020 10:05 AM

## 2020-05-17 ENCOUNTER — Other Ambulatory Visit: Payer: Self-pay

## 2020-05-17 ENCOUNTER — Other Ambulatory Visit: Payer: Self-pay | Admitting: *Deleted

## 2020-05-17 ENCOUNTER — Other Ambulatory Visit: Payer: BC Managed Care – PPO

## 2020-05-17 DIAGNOSIS — Z20822 Contact with and (suspected) exposure to covid-19: Secondary | ICD-10-CM

## 2020-05-19 LAB — NOVEL CORONAVIRUS, NAA: SARS-CoV-2, NAA: NOT DETECTED

## 2020-05-19 LAB — SARS-COV-2, NAA 2 DAY TAT

## 2020-05-24 ENCOUNTER — Other Ambulatory Visit: Payer: BC Managed Care – PPO

## 2020-05-24 ENCOUNTER — Telehealth: Payer: BC Managed Care – PPO | Admitting: Nurse Practitioner

## 2020-05-24 DIAGNOSIS — J00 Acute nasopharyngitis [common cold]: Secondary | ICD-10-CM | POA: Diagnosis not present

## 2020-05-24 MED ORDER — FLUTICASONE PROPIONATE 50 MCG/ACT NA SUSP
2.0000 | Freq: Every day | NASAL | 6 refills | Status: DC
Start: 2020-05-24 — End: 2021-02-27

## 2020-05-24 NOTE — Progress Notes (Signed)
We are sorry you are not feeling well.  Here is how we plan to help!  Based on what you have shared with me, it looks like you may have a viral upper respiratory infection.  Upper respiratory infections are caused by a large number of viruses; however, rhinovirus is the most common cause.   Symptoms vary from person to person, with common symptoms including sore throat, cough, fatigue or lack of energy and feeling of general discomfort.  A low-grade fever of up to 100.4 may present, but is often uncommon.  Symptoms vary however, and are closely related to a person's age or underlying illnesses.  The most common symptoms associated with an upper respiratory infection are nasal discharge or congestion, cough, sneezing, headache and pressure in the ears and face.  These symptoms usually persist for about 3 to 10 days, but can last up to 2 weeks.  It is important to know that upper respiratory infections do not cause serious illness or complications in most cases.    Upper respiratory infections can be transmitted from person to person, with the most common method of transmission being a person's hands.  The virus is able to live on the skin and can infect other persons for up to 2 hours after direct contact.  Also, these can be transmitted when someone coughs or sneezes; thus, it is important to cover the mouth to reduce this risk.  To keep the spread of the illness at bay, good hand hygiene is very important.  This is an infection that is most likely caused by a virus. There are no specific treatments other than to help you with the symptoms until the infection runs its course.  We are sorry you are not feeling well.  Here is how we plan to help!   For nasal congestion, you may use an oral decongestants such as Mucinex D or if you have glaucoma or high blood pressure use plain Mucinex.  Saline nasal spray or nasal drops can help and can safely be used as often as needed for congestion.  For your congestion,  I have prescribed Fluticasone nasal spray one spray in each nostril twice a day  If you do not have a history of heart disease, hypertension, diabetes or thyroid disease, prostate/bladder issues or glaucoma, you may also use Sudafed to treat nasal congestion.  It is highly recommended that you consult with a pharmacist or your primary care physician to ensure this medication is safe for you to take.     If you have a cough, you may use cough suppressants such as Delsym and Robitussin.  If you have glaucoma or high blood pressure, you can also use Coricidin HBP.   For cough you can try delsym or mucinex OTC.  If you have a sore or scratchy throat, use a saltwater gargle-  to  teaspoon of salt dissolved in a 4-ounce to 8-ounce glass of warm water.  Gargle the solution for approximately 15-30 seconds and then spit.  It is important not to swallow the solution.  You can also use throat lozenges/cough drops and Chloraseptic spray to help with throat pain or discomfort.  Warm or cold liquids can also be helpful in relieving throat pain.  For headache, pain or general discomfort, you can use Ibuprofen or Tylenol as directed.   Some authorities believe that zinc sprays or the use of Echinacea may shorten the course of your symptoms.   HOME CARE  Only take medications as instructed by  your medical team.  Be sure to drink plenty of fluids. Water is fine as well as fruit juices, sodas and electrolyte beverages. You may want to stay away from caffeine or alcohol. If you are nauseated, try taking small sips of liquids. How do you know if you are getting enough fluid? Your urine should be a pale yellow or almost colorless.  Get rest.  Taking a steamy shower or using a humidifier may help nasal congestion and ease sore throat pain. You can place a towel over your head and breathe in the steam from hot water coming from a faucet.  Using a saline nasal spray works much the same way.  Cough drops, hard  candies and sore throat lozenges may ease your cough.  Avoid close contacts especially the very young and the elderly  Cover your mouth if you cough or sneeze  Always remember to wash your hands.   GET HELP RIGHT AWAY IF:  You develop worsening fever.  If your symptoms do not improve within 10 days  You develop yellow or green discharge from your nose over 3 days.  You have coughing fits  You develop a severe head ache or visual changes.  You develop shortness of breath, difficulty breathing or start having chest pain  Your symptoms persist after you have completed your treatment plan  MAKE SURE YOU   Understand these instructions.  Will watch your condition.  Will get help right away if you are not doing well or get worse.  Your e-visit answers were reviewed by a board certified advanced clinical practitioner to complete your personal care plan. Depending upon the condition, your plan could have included both over the counter or prescription medications. Please review your pharmacy choice. If there is a problem, you may call our nursing hot line at and have the prescription routed to another pharmacy. Your safety is important to [place]. If you have drug allergies check your prescription carefully.   You can use MyChart to ask questions about todays visit, request a non-urgent call back, or ask for a work or school excuse for 24 hours related to this e-Visit. If it has been greater than 24 hours you will need to follow up with your provider, or enter a new e-Visit to address those concerns. You will get an e-mail in the next two days asking about your experience.  I hope that your e-visit has been valuable and will speed your recovery. Thank you for using e-visits.   5-10 minutes spent reviewing and documenting in chart.

## 2020-05-26 ENCOUNTER — Other Ambulatory Visit: Payer: Self-pay

## 2020-05-26 ENCOUNTER — Other Ambulatory Visit: Payer: BC Managed Care – PPO

## 2020-05-26 DIAGNOSIS — Z20822 Contact with and (suspected) exposure to covid-19: Secondary | ICD-10-CM

## 2020-05-28 LAB — SARS-COV-2, NAA 2 DAY TAT

## 2020-05-28 LAB — NOVEL CORONAVIRUS, NAA: SARS-CoV-2, NAA: NOT DETECTED

## 2020-06-20 ENCOUNTER — Other Ambulatory Visit: Payer: BC Managed Care – PPO

## 2020-06-20 ENCOUNTER — Other Ambulatory Visit: Payer: Self-pay

## 2020-06-20 DIAGNOSIS — Z20822 Contact with and (suspected) exposure to covid-19: Secondary | ICD-10-CM

## 2020-06-21 LAB — SARS-COV-2, NAA 2 DAY TAT

## 2020-06-21 LAB — NOVEL CORONAVIRUS, NAA: SARS-CoV-2, NAA: NOT DETECTED

## 2020-07-04 ENCOUNTER — Other Ambulatory Visit: Payer: BC Managed Care – PPO

## 2020-10-29 ENCOUNTER — Other Ambulatory Visit: Payer: Self-pay | Admitting: Adult Health

## 2020-11-06 ENCOUNTER — Telehealth: Payer: Self-pay | Admitting: Adult Health

## 2020-11-06 NOTE — Telephone Encounter (Signed)
Pt having more swelling, would like a refill on the HCTZ  Please advise & notify pt    [facility]

## 2020-11-24 ENCOUNTER — Other Ambulatory Visit: Payer: Self-pay

## 2020-11-24 ENCOUNTER — Encounter: Payer: Self-pay | Admitting: Adult Health

## 2020-11-24 ENCOUNTER — Ambulatory Visit (INDEPENDENT_AMBULATORY_CARE_PROVIDER_SITE_OTHER): Payer: BC Managed Care – PPO | Admitting: Adult Health

## 2020-11-24 VITALS — BP 108/81 | HR 79 | Ht 61.5 in | Wt 213.8 lb

## 2020-11-24 DIAGNOSIS — F419 Anxiety disorder, unspecified: Secondary | ICD-10-CM

## 2020-11-24 DIAGNOSIS — F32A Depression, unspecified: Secondary | ICD-10-CM

## 2020-11-24 DIAGNOSIS — M7989 Other specified soft tissue disorders: Secondary | ICD-10-CM | POA: Diagnosis not present

## 2020-11-24 MED ORDER — HYDROCHLOROTHIAZIDE 12.5 MG PO CAPS: 12.5000 mg | ORAL_CAPSULE | Freq: Every day | ORAL | 12 refills | Status: DC

## 2020-11-24 MED ORDER — PAROXETINE HCL 10 MG PO TABS: 10.0000 mg | ORAL_TABLET | Freq: Every day | ORAL | 6 refills | Status: DC

## 2020-11-24 NOTE — Progress Notes (Signed)
  Subjective:     Patient ID: Katie Vargas, female   DOB: 19-Jun-1991, 30 y.o.   MRN: [id number]  HPI Katie Vargas is a 30 year old white female,engaged getting married 01/06/21, G1P1 in to get meds refilled. PCP is Dr. Toya Smothers  Review of Systems Swelling is controlled on Microzide, had negative Echo Doing OK on paxil 10 mg  Reviewed past medical,surgical, social and family history. Reviewed medications and allergies.     Objective:   Physical Exam BP 108/81 (BP Location: Right Arm, Patient Position: Sitting, Cuff Size: Large)   Pulse 79   Ht 5' 1.5" (1.562 m)   Wt 213 lb 12.8 oz (97 kg)   LMP 11/23/2020 (Exact Date)   BMI 39.74 kg/m  Skin warm and dry. Lungs: clear to ausculation bilaterally. Cardiovascular: regular rate and rhythm.   PHQ 9 score is 13, no SI, on paxil  Upstream - 11/24/20 1307      Pregnancy Intention Screening   Does the patient want to become pregnant in the next year? No    Does the patient's partner want to become pregnant in the next year? No    Would the patient like to discuss contraceptive options today? No      Contraception Wrap Up   Current Method Female Condom    End Method Female Condom    Contraception Counseling Provided No          Assessment:     1. Swelling of both lower extremities Controlled with microzide   2. Anxiety and depression Will refill paxil Meds ordered this encounter  Medications  . PARoxetine (PAXIL) 10 MG tablet    Sig: Take 1 tablet (10 mg total) by mouth daily.    Dispense:  30 tablet    Refill:  6    Order Specific Question:   Supervising Provider    Answer:   Despina Hidden, LUTHER H [[id number]]  . hydrochlorothiazide (MICROZIDE) 12.5 MG capsule    Sig: Take 1 capsule (12.5 mg total) by mouth daily.    Dispense:  30 capsule    Refill:  12    Pt left meds at home    Order Specific Question:   Supervising Provider    Answer:   Lazaro Arms [[id number]]      Plan:     Return 01/11/21 for pap and physical

## 2021-01-01 ENCOUNTER — Other Ambulatory Visit: Payer: BC Managed Care – PPO | Admitting: Adult Health

## 2021-02-23 ENCOUNTER — Telehealth: Payer: Self-pay | Admitting: Obstetrics & Gynecology

## 2021-02-23 NOTE — Telephone Encounter (Signed)
Pt is on her cycle now for 3 weeks, had lots of clots early on, now spotting but has not stopped bleeding  Please advise & notify pt     [facility]

## 2021-02-23 NOTE — Telephone Encounter (Signed)
Called patient- reports that for the most part her menses are regular each month.  (Previously took provera to kick start her period).  This time, menses has lasted for a few weeks- moderate then thought it was only light spotting then returned to moderate/heavy today.  Denies fatigue, dizziness or syncope.  Advised hydration and NSAIDs for now.  Follow up in July as scheduled or sooner if bleeding become heavier or does not taper on its own.

## 2021-02-26 ENCOUNTER — Telehealth: Payer: Self-pay | Admitting: Adult Health

## 2021-02-26 NOTE — Telephone Encounter (Signed)
Dr. Charlotta Newton called her Friday and discussed bleeding with her. Her recommendation was to keep pap and physical for end of July. Patient states bleeding worse over weekend. Do you want to send in meds or have her come in?

## 2021-02-26 NOTE — Telephone Encounter (Signed)
Pt states she's had her cycle for a month, was spotting but over the weekend having heavy bleeding, clots, bad cramps, soaking a tampon every hour since Saturday  Please advise, mentioned we told her she may need meds to help stop her cycle  [facility]

## 2021-02-26 NOTE — Telephone Encounter (Signed)
On period for 4 weeks,none for May, bleeding heavier with clots, to come in am

## 2021-02-27 ENCOUNTER — Other Ambulatory Visit: Payer: Self-pay

## 2021-02-27 ENCOUNTER — Encounter: Payer: Self-pay | Admitting: Adult Health

## 2021-02-27 ENCOUNTER — Ambulatory Visit: Payer: BC Managed Care – PPO | Admitting: Adult Health

## 2021-02-27 VITALS — BP 106/76 | HR 81 | Ht 61.5 in | Wt 215.0 lb

## 2021-02-27 DIAGNOSIS — N939 Abnormal uterine and vaginal bleeding, unspecified: Secondary | ICD-10-CM | POA: Insufficient documentation

## 2021-02-27 LAB — POCT URINE PREGNANCY: Preg Test, Ur: NEGATIVE

## 2021-02-27 MED ORDER — MEGESTROL ACETATE 40 MG PO TABS: ORAL_TABLET | ORAL | 1 refills | Status: DC

## 2021-02-27 NOTE — Progress Notes (Signed)
  Subjective:     Patient ID: Katie Vargas, female   DOB: April 27, 1991, 30 y.o.   MRN: [id number]  HPI Katie Vargas is a 30 year old white female,married, G1P1 in complaining of bleeding since 30/3/22, was heavier over the weekend with clots. PCP is Dr Toya Smothers.  Review of Systems +bleeding +weight gain Reviewed past medical,surgical, social and family history. Reviewed medications and allergies.     Objective:   Physical Exam BP 106/76 (BP Location: Right Arm, Patient Position: Sitting, Cuff Size: Normal)   Pulse 81   Ht 5' 1.5" (1.562 m)   Wt 215 lb (97.5 kg)   LMP 02/02/2021 Comment: still bleeding  BMI 39.97 kg/m  UPT is negative.Skin warm and dry.Pelvic: external genitalia is normal in appearance no lesions, vagina: +blood,urethra has no lesions or masses noted, cervix:smooth and bulbous, uterus: normal size, shape and contour, non tender, no masses felt, adnexa: no masses or tenderness noted. Bladder is non tender and no masses felt.     Upstream - 02/27/21 7588       Pregnancy Intention Screening   Does the patient want to become pregnant in the next year? Unsure    Does the patient's partner want to become pregnant in the next year? No    Would the patient like to discuss contraceptive options today? No      Contraception Wrap Up   Current Method Female Condom    End Method Female Condom    Contraception Counseling Provided No              Upstream - 02/27/21 0903       Pregnancy Intention Screening   Does the patient want to become pregnant in the next year? Unsure    Does the patient's partner want to become pregnant in the next year? No    Would the patient like to discuss contraceptive options today? No      Contraception Wrap Up   Current Method Female Condom    End Method Female Condom    Contraception Counseling Provided No            Examination chaperoned by Faith Rogue LPN  Assessment:     1. Abnormal uterine bleeding (AUB) Will check labs Will rx  megace to try to stop bleeding Meds ordered this encounter  Medications   megestrol (MEGACE) 40 MG tablet    Sig: Take 3 x 5 days then 2 x 5 days then 1 daily    Dispense:  45 tablet    Refill:  1    Order Specific Question:   Supervising Provider    Answer:   Lazaro Arms [[id number]]   Will talk when labs back - CBC - Comprehensive metabolic panel - TSH - POCT urine pregnancy     Plan:    Take OTC PNV Return 03/28/21 for pap and physical

## 2021-02-28 LAB — COMPREHENSIVE METABOLIC PANEL
ALT: 11 IU/L (ref 0–32)
AST: 12 IU/L (ref 0–40)
Albumin/Globulin Ratio: 1.2 (ref 1.2–2.2)
Albumin: 4.1 g/dL (ref 3.9–5.0)
Alkaline Phosphatase: 72 IU/L (ref 44–121)
BUN/Creatinine Ratio: 10 (ref 9–23)
BUN: 7 mg/dL (ref 6–20)
Bilirubin Total: <0.2 mg/dL (ref 0.0–1.2)
CO2: 20 mmol/L (ref 20–29)
Calcium: 8.6 mg/dL — ABNORMAL LOW (ref 8.7–10.2)
Chloride: 102 mmol/L (ref 96–106)
Creatinine, Ser: 0.71 mg/dL (ref 0.57–1.00)
Globulin, Total: 3.5 g/dL (ref 1.5–4.5)
Glucose: 105 mg/dL — ABNORMAL HIGH (ref 65–99)
Potassium: 4 mmol/L (ref 3.5–5.2)
Sodium: 138 mmol/L (ref 134–144)
Total Protein: 7.6 g/dL (ref 6.0–8.5)
eGFR: 117 mL/min/{1.73_m2} (ref >59)

## 2021-02-28 LAB — CBC
Hematocrit: 34.8 % (ref 34.0–46.6)
Hemoglobin: 11.3 g/dL (ref 11.1–15.9)
MCH: 25.8 pg — ABNORMAL LOW (ref 26.6–33.0)
MCHC: 32.5 g/dL (ref 31.5–35.7)
MCV: 80 fL (ref 79–97)
Platelets: 355 10*3/uL (ref 150–450)
RBC: 4.38 x10E6/uL (ref 3.77–5.28)
RDW: 14.3 % (ref 11.7–15.4)
WBC: 11 10*3/uL — ABNORMAL HIGH (ref 3.4–10.8)

## 2021-02-28 LAB — TSH: TSH: 3.28 u[IU]/mL (ref 0.450–4.500)

## 2021-03-28 ENCOUNTER — Ambulatory Visit (INDEPENDENT_AMBULATORY_CARE_PROVIDER_SITE_OTHER): Payer: BC Managed Care – PPO | Admitting: Adult Health

## 2021-03-28 ENCOUNTER — Other Ambulatory Visit: Payer: Self-pay

## 2021-03-28 ENCOUNTER — Encounter: Payer: Self-pay | Admitting: Adult Health

## 2021-03-28 ENCOUNTER — Other Ambulatory Visit (HOSPITAL_COMMUNITY)
Admission: RE | Admit: 2021-03-28 | Discharge: 2021-03-28 | Disposition: A | Discharge status: Home / Self Care | Payer: BC Managed Care – PPO | Source: Ambulatory Visit | Attending: Adult Health | Admitting: Adult Health

## 2021-03-28 VITALS — BP 114/80 | HR 100 | Ht 61.5 in | Wt 216.0 lb

## 2021-03-28 DIAGNOSIS — Z3202 Encounter for pregnancy test, result negative: Secondary | ICD-10-CM

## 2021-03-28 DIAGNOSIS — Z01419 Encounter for gynecological examination (general) (routine) without abnormal findings: Secondary | ICD-10-CM | POA: Diagnosis present

## 2021-03-28 DIAGNOSIS — F419 Anxiety disorder, unspecified: Secondary | ICD-10-CM | POA: Diagnosis not present

## 2021-03-28 DIAGNOSIS — F32A Depression, unspecified: Secondary | ICD-10-CM

## 2021-03-28 DIAGNOSIS — Z8742 Personal history of other diseases of the female genital tract: Secondary | ICD-10-CM

## 2021-03-28 DIAGNOSIS — Z30011 Encounter for initial prescription of contraceptive pills: Secondary | ICD-10-CM | POA: Insufficient documentation

## 2021-03-28 LAB — POCT URINE PREGNANCY: Preg Test, Ur: NEGATIVE

## 2021-03-28 MED ORDER — PAROXETINE HCL 20 MG PO TABS: 20.0000 mg | ORAL_TABLET | Freq: Every day | ORAL | 3 refills | Status: DC

## 2021-03-28 MED ORDER — LO LOESTRIN FE 1 MG-10 MCG / 10 MCG PO TABS: 1.0000 | ORAL_TABLET | Freq: Every day | ORAL | 11 refills | Status: DC

## 2021-03-28 NOTE — Progress Notes (Signed)
Patient ID: Katie Vargas, female   DOB: 1990-10-13, 30 y.o.   MRN: [id number] History of Present Illness: Katie Vargas is a 30 year old white female, married, G1P1 in for a well woman gyn exam and pap,her pap last year was abnormal. She thinks Paxil needs increasing, is irritable and anxious, and periods irregular. Lab Results  Component Value Date   DIAGPAP - Low grade squamous intraepithelial lesion (LSIL) (A) 12/23/2019   HPVHIGH Negative 12/23/2019   PCP is Dr Toya Smothers   Current Medications, Allergies, Past Medical History, Past Surgical History, Family History and Social History were reviewed in [employer] electronic medical record.     Review of Systems: Patient denies any headaches, hearing loss, fatigue, blurred vision, shortness of breath, chest pain, abdominal pain, problems with bowel movements, urination, or intercourse. No joint pain or mood swings.  See HPI for positives    Physical Exam:BP 114/80 (BP Location: Left Arm, Patient Position: Sitting, Cuff Size: Large)   Pulse 100   Ht 5' 1.5" (1.562 m)   Wt 216 lb (98 kg)   BMI 40.15 kg/m  UPT is negative. General:  Well developed, well nourished, no acute distress Skin:  Warm and dry Neck:  Midline trachea, normal thyroid, good ROM, no lymphadenopathy Lungs; Clear to auscultation bilaterally Breast:  No dominant palpable mass, retraction, or nipple discharge Cardiovascular: Regular rate and rhythm Abdomen:  Soft, non tender, no hepatosplenomegaly Pelvic:  External genitalia is normal in appearance, no lesions.  The vagina is normal in appearance. Urethra has no lesions or masses. The cervix is bulbous.  Pap with GC/CHL and  HR HPV genotyping performed.Uterus is felt to be normal size, shape, and contour.  No adnexal masses or tenderness noted.Bladder is non tender, no masses felt. Extremities/musculoskeletal:  No swelling or varicosities noted, no clubbing or cyanosis Psych:  No mood changes, alert and cooperative,seems  happy AA is 0  Fall risk is low Depression screen [facility] 2/9 03/28/2021 11/24/2020 12/23/2019  Decreased Interest 1 0 2  Down, Depressed, Hopeless 0 1 1  PHQ - 2 Score 1 1 3   Altered sleeping 2 1 2   Tired, decreased energy 3 3 3   Change in appetite 0 2 3  Feeling bad or failure about yourself  0 0 1  Trouble concentrating 1 3 2   Moving slowly or fidgety/restless 0 3 1  Suicidal thoughts 0 0 0  PHQ-9 Score 7 13 15   Difficult doing work/chores - - Somewhat difficult    GAD 7 : Generalized Anxiety Score 03/28/2021 12/23/2019  Nervous, Anxious, on Edge 1 2  Control/stop worrying 0 2  Worry too much - different things 1 3  Trouble relaxing 1 1  Restless 1 0  Easily annoyed or irritable 3 3  Afraid - awful might happen 0 0  Total GAD 7 Score 7 11  Anxiety Difficulty - Very difficult      Upstream - 03/28/21 1602       Pregnancy Intention Screening   Does the patient want to become pregnant in the next year? No    Does the patient's partner want to become pregnant in the next year? No    Would the patient like to discuss contraceptive options today? Yes      Contraception Wrap Up   Current Method Female Condom    End Method Oral Contraceptive    Contraception Counseling Provided Yes            Examination chaperoned by LPN  Impression and Plan: 1. Encounter for gynecological examination with Papanicolaou smear of cervix Pap sent Physical in 1 year Pap in 3 if normal  2. Pregnancy examination or test, negative result   3. Anxiety and depression Will increase paxil o 20 mg Meds ordered this encounter  Medications   PARoxetine (PAXIL) 20 MG tablet    Sig: Take 1 tablet (20 mg total) by mouth daily.    Dispense:  30 tablet    Refill:  3    Order Specific Question:   Supervising Provider    Answer:   Duane Lope H [[id number]]   Norethindrone-Ethinyl Estradiol-Fe Biphas (LO LOESTRIN FE) 1 MG-10 MCG / 10 MCG tablet    Sig: Take 1 tablet by mouth daily. Take 1  daily by mouth    Dispense:  28 tablet    Refill:  11    BIN [id number], PCN [id number], GRP [id number]    Order Specific Question:   Supervising Provider    Answer:   Duane Lope H [[id number]]     4. Encounter for initial prescription of contraceptive pills Will try lo loestrin, can start today, 3 packs given use condoms for 1 pack  5. History of abnormal cervical Pap smear Pap sent

## 2021-04-02 LAB — CYTOLOGY - PAP
Adequacy: ABSENT
Chlamydia: NEGATIVE
Comment: NEGATIVE
Comment: NEGATIVE
Comment: NORMAL
Diagnosis: NEGATIVE
Diagnosis: REACTIVE
High risk HPV: NEGATIVE
Neisseria Gonorrhea: NEGATIVE

## 2021-04-03 ENCOUNTER — Other Ambulatory Visit: Payer: Self-pay | Admitting: Adult Health

## 2021-04-03 MED ORDER — METRONIDAZOLE 500 MG PO TABS
500.0000 mg | ORAL_TABLET | Freq: Two times a day (BID) | ORAL | 0 refills | Status: DC
Start: 2021-04-03 — End: 2021-05-10

## 2021-04-03 NOTE — Progress Notes (Signed)
+  BV on pap rx flagyl  

## 2021-04-27 ENCOUNTER — Other Ambulatory Visit: Payer: Self-pay | Admitting: Adult Health

## 2021-04-27 MED ORDER — MEGESTROL ACETATE 40 MG PO TABS: ORAL_TABLET | ORAL | 1 refills | Status: DC

## 2021-04-27 NOTE — Progress Notes (Signed)
Stop OCs and start megace

## 2021-05-10 ENCOUNTER — Other Ambulatory Visit: Payer: Self-pay | Admitting: Adult Health

## 2021-05-10 MED ORDER — METRONIDAZOLE 500 MG PO TABS: 500.0000 mg | ORAL_TABLET | Freq: Two times a day (BID) | ORAL | 0 refills | Status: DC

## 2021-05-10 MED ORDER — NORETHIN ACE-ETH ESTRAD-FE 1.5-30 MG-MCG PO TABS: 1.0000 | ORAL_TABLET | Freq: Every day | ORAL | 11 refills | Status: DC

## 2021-05-10 NOTE — Progress Notes (Signed)
Rx flagyl and loestrin 1.5/30

## 2021-06-28 ENCOUNTER — Ambulatory Visit: Payer: BC Managed Care – PPO | Admitting: Adult Health

## 2021-07-04 ENCOUNTER — Other Ambulatory Visit: Payer: Self-pay | Admitting: Adult Health

## 2021-07-04 MED ORDER — PAROXETINE HCL 30 MG PO TABS: 30.0000 mg | ORAL_TABLET | Freq: Every day | ORAL | 2 refills | Status: DC

## 2021-07-04 NOTE — Progress Notes (Signed)
Will increase paxil to 30 mg

## 2021-07-09 ENCOUNTER — Ambulatory Visit: Payer: BC Managed Care – PPO | Admitting: Adult Health

## 2021-08-15 ENCOUNTER — Ambulatory Visit: Payer: BC Managed Care – PPO | Admitting: Adult Health

## 2021-10-02 ENCOUNTER — Other Ambulatory Visit: Payer: Self-pay | Admitting: Adult Health

## 2022-02-14 ENCOUNTER — Other Ambulatory Visit (HOSPITAL_COMMUNITY): Payer: Self-pay

## 2022-02-14 ENCOUNTER — Other Ambulatory Visit: Payer: Self-pay | Admitting: Adult Health

## 2022-02-14 MED ORDER — HYDROCHLOROTHIAZIDE 12.5 MG PO CAPS
12.5000 mg | ORAL_CAPSULE | Freq: Every day | ORAL | 3 refills | Status: DC
  Filled 2022-02-14: qty 90, 90d supply, fill #0

## 2022-02-14 NOTE — Telephone Encounter (Signed)
Refill Microzide

## 2022-02-15 ENCOUNTER — Other Ambulatory Visit: Payer: Self-pay | Admitting: Adult Health

## 2022-02-15 MED ORDER — HYDROCHLOROTHIAZIDE 12.5 MG PO CAPS: 12.5000 mg | ORAL_CAPSULE | Freq: Every day | ORAL | 3 refills | Status: DC

## 2022-02-15 NOTE — Progress Notes (Signed)
Refilled [facility]

## 2022-02-18 ENCOUNTER — Other Ambulatory Visit: Payer: Self-pay | Admitting: Adult Health

## 2022-05-12 ENCOUNTER — Other Ambulatory Visit: Payer: Self-pay | Admitting: Adult Health

## 2022-06-10 ENCOUNTER — Ambulatory Visit (INDEPENDENT_AMBULATORY_CARE_PROVIDER_SITE_OTHER): Payer: BC Managed Care – PPO | Admitting: *Deleted

## 2022-06-10 ENCOUNTER — Encounter: Payer: Self-pay | Admitting: *Deleted

## 2022-06-10 ENCOUNTER — Other Ambulatory Visit: Payer: Self-pay | Admitting: Adult Health

## 2022-06-10 VITALS — BP 131/91 | HR 88 | Ht 61.5 in | Wt 236.0 lb

## 2022-06-10 DIAGNOSIS — Z3201 Encounter for pregnancy test, result positive: Secondary | ICD-10-CM

## 2022-06-10 LAB — POCT URINE PREGNANCY: Preg Test, Ur: POSITIVE — AB

## 2022-06-10 MED ORDER — ESCITALOPRAM OXALATE 10 MG PO TABS: 10.0000 mg | ORAL_TABLET | Freq: Every day | ORAL | 2 refills | Status: DC

## 2022-06-10 NOTE — Progress Notes (Signed)
Rx lexapro

## 2022-06-10 NOTE — Progress Notes (Signed)
   NURSE VISIT- PREGNANCY CONFIRMATION   SUBJECTIVE:  Katie Vargas is a 31 y.o. G78P1001 female at [redacted]w[redacted]d by certain LMP of Patient's last menstrual period was 04/29/2022. Here for pregnancy confirmation.  Home pregnancy test: positive x 4.   She reports nausea.  She is taking prenatal vitamins. Pt stopped HCTZ and Paxil on 06/06/22. Pt has been emotional and "crying over everything". Pt has also been lightheaded. JAG advised can take HCTZ. She will send in Lexapro in place of Paxil.   OBJECTIVE:  BP (!) 131/91 (BP Location: Left Arm, Patient Position: Sitting, Cuff Size: Normal)   Pulse 88   Ht 5' 1.5" (1.562 m)   Wt 236 lb (107 kg)   LMP 04/29/2022   BMI 43.87 kg/m   Appears well, in no apparent distress  Results for orders placed or performed in visit on 06/10/22 (from the past 24 hour(s))  POCT urine pregnancy   Collection Time: 06/10/22  3:58 PM  Result Value Ref Range   Preg Test, Ur Positive (A) Negative    ASSESSMENT: Positive pregnancy test, [redacted]w[redacted]d by LMP    PLAN: Schedule for dating ultrasound in 2 weeks Prenatal vitamins: continue   Nausea medicines: not currently needed   OB packet given: Yes  [name]  06/10/2022 4:42 PM

## 2022-06-21 ENCOUNTER — Telehealth: Payer: Self-pay

## 2022-06-21 MED ORDER — ONDANSETRON HCL 4 MG PO TABS: 4.0000 mg | ORAL_TABLET | Freq: Three times a day (TID) | ORAL | 1 refills | Status: DC | PRN

## 2022-06-21 NOTE — Addendum Note (Signed)
Addended by: Derrek Monaco A on: 06/21/2022 01:53 PM   Modules accepted: Orders

## 2022-06-21 NOTE — Telephone Encounter (Signed)
Returned patient's call. Requesting nausea medication. Thinks she may have a stomach virus as she has been having vomiting and diarrhea. Unsure if the nausea is just related to this or early pregnancy but would still like to have medication on hand.  Advised to back with pharmacy later this afternoon.

## 2022-06-21 NOTE — Telephone Encounter (Signed)
Rx sent in for zofran.   

## 2022-06-21 NOTE — Telephone Encounter (Signed)
Patient called and wanted some nausea medication called in [facility])

## 2022-06-24 ENCOUNTER — Other Ambulatory Visit: Payer: Self-pay | Admitting: Obstetrics & Gynecology

## 2022-06-24 DIAGNOSIS — O3680X0 Pregnancy with inconclusive fetal viability, not applicable or unspecified: Secondary | ICD-10-CM

## 2022-06-25 ENCOUNTER — Encounter: Payer: BC Managed Care – PPO | Admitting: *Deleted

## 2022-06-25 ENCOUNTER — Ambulatory Visit (INDEPENDENT_AMBULATORY_CARE_PROVIDER_SITE_OTHER): Payer: BC Managed Care – PPO | Admitting: Adult Health

## 2022-06-25 ENCOUNTER — Ambulatory Visit (INDEPENDENT_AMBULATORY_CARE_PROVIDER_SITE_OTHER): Payer: BC Managed Care – PPO

## 2022-06-25 ENCOUNTER — Encounter: Payer: BC Managed Care – PPO | Admitting: Women's Health

## 2022-06-25 ENCOUNTER — Encounter: Payer: Self-pay | Admitting: Adult Health

## 2022-06-25 ENCOUNTER — Other Ambulatory Visit: Payer: Self-pay | Admitting: Obstetrics & Gynecology

## 2022-06-25 VITALS — BP 120/85 | HR 82 | Ht 61.0 in | Wt 229.0 lb

## 2022-06-25 DIAGNOSIS — O200 Threatened abortion: Secondary | ICD-10-CM | POA: Diagnosis not present

## 2022-06-25 DIAGNOSIS — O3680X0 Pregnancy with inconclusive fetal viability, not applicable or unspecified: Secondary | ICD-10-CM

## 2022-06-25 DIAGNOSIS — Z3A08 8 weeks gestation of pregnancy: Secondary | ICD-10-CM | POA: Diagnosis not present

## 2022-06-25 NOTE — Progress Notes (Signed)
  Subjective:     Patient ID: Katie Vargas, female   DOB: [birth year], 31 y.o.   MRN: [id number]  HPI Girtrude is a 31 year old white female, married, G2P1001 in for dating [place] was supposed to be 8+1 week, but on [place] CRL 6+4 weeks and no FHM. Her husband was told as teen that he was sterile.     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   ADEQPAP  12/23/2019 1624    Satisfactory for evaluation; transformation zone component PRESENT.   PCP is Dr Georga Bora   Review of Systems No FHM and size <dates on [place] No bleeding Had GI bug Friday with some back pain Reviewed past medical,surgical, social and family history. Reviewed medications and allergies.     Objective:   Physical Exam BP 120/85 (BP Location: Left Arm, Patient Position: Sitting, Cuff Size: Normal)   Pulse 82   Ht 5\' 1"  (1.549 m)   Wt 229 lb (103.9 kg)   LMP 04/29/2022   BMI 43.27 kg/m     Skin warm and dry.  Lungs: clear to ausculation bilaterally. Cardiovascular: regular rate and rhythm.  [place]: [place] TA/TV:6+4 wks,single IUP with yolk sac,no fetal heart tones visualized,CRL 6.59 mm,GS 22.9 mm,simple right corpus luteal cyst 2.4 x 2.3 x 2.4 cm,normal left ovary   Upstream - 06/25/22 1047       Pregnancy Intention Screening   Does the patient want to become pregnant in the next year? N/A    Does the patient's partner want to become pregnant in the next year? N/A    Would the patient like to discuss contraceptive options today? N/A      Contraception Wrap Up   Current Method Pregnant/Seeking Pregnancy    End Method Pregnant/Seeking Pregnancy              Assessment:     1. Threatened miscarriage in early pregnancy US showed CRL 6.59 mm, GS 22.9 mm and no FHM Size <dates No bleeding  Discussed with Kimaria and her husband this could be miscarriage but will recheck [place] in about 10 days for signs of growth. She says this is in God's hands. Will check QHCG as baseline and ABORH Will get follow up US 07/04/22 at [facility] and talk when results back    Plan:    Call with any bleeding  Follow up TBD

## 2022-06-25 NOTE — Progress Notes (Signed)
[place] TA/TV:6+4 wks,single IUP with yolk sac,no fetal heart tones visualized,CRL 6.59 mm,GS 22.9 mm,simple right corpus luteal cyst 2.4 x 2.3 x 2.4 cm,normal left ovary,Katie Vargas discussed results with patient

## 2022-06-28 LAB — ABO/RH: Rh Factor: POSITIVE

## 2022-06-28 LAB — BETA HCG QUANT (REF LAB): hCG Quant: 36961 m[IU]/mL

## 2022-07-04 ENCOUNTER — Ambulatory Visit
Admission: RE | Admit: 2022-07-04 | Discharge: 2022-07-04 | Disposition: A | Discharge status: Home / Self Care | Payer: BC Managed Care – PPO | Source: Ambulatory Visit | Attending: Adult Health | Admitting: Adult Health

## 2022-07-04 ENCOUNTER — Other Ambulatory Visit: Payer: Self-pay | Admitting: Adult Health

## 2022-07-04 ENCOUNTER — Telehealth: Payer: Self-pay | Admitting: Adult Health

## 2022-07-04 ENCOUNTER — Ambulatory Visit (INDEPENDENT_AMBULATORY_CARE_PROVIDER_SITE_OTHER): Payer: BC Managed Care – PPO | Admitting: Obstetrics and Gynecology

## 2022-07-04 DIAGNOSIS — O200 Threatened abortion: Secondary | ICD-10-CM

## 2022-07-04 NOTE — Telephone Encounter (Signed)
Left message to check my chart about [place] and for repeat US, if that is not good time please let us know

## 2022-07-04 NOTE — Progress Notes (Signed)
GYNECOLOGY VISIT  Patient name: Katie Vargas MRN [id number]  Date of birth: 05-08-1991 Chief Complaint:   Results   History:  Katie Vargas is a 31 y.o. G2P1001 being seen today for discussion of [place]. Prior non-diagnostic but suspicious for SAB. Denies bleeding, cramping, pain, and abnormal vaginal discharge. This is a desired pregnancy     Past Medical History:  Diagnosis Date   Anxiety    Depression    Vaginal Pap smear, abnormal     Review of Systems:  Comprehensive review of systems was otherwise negative.   Objective:  Physical Exam LMP 04/29/2022    Physical Exam Vitals and nursing note reviewed.  HENT:     Head: Normocephalic and atraumatic.  Pulmonary:     Effort: Pulmonary effort is normal.  Neurological:     Mental Status: She is alert.    Labs and Imaging US OB LESS THAN 14 WEEKS WITH OB TRANSVAGINAL  Result Date: 07/04/2022 CLINICAL DATA:  No fetal heart tones, beta hCG October 24 of 36,961 no same day beta HCG available at time dictation. EXAM: OBSTETRIC <14 WK [place] AND TRANSVAGINAL OB US TECHNIQUE: Both transabdominal and transvaginal ultrasound examinations were performed for complete evaluation of the gestation as well as the maternal uterus, adnexal regions, and pelvic cul-de-sac. Transvaginal technique was performed to assess early pregnancy. COMPARISON:  Ultrasound June 25, 2022. FINDINGS: Intrauterine gestational sac: Single Yolk sac:  Not Visualized. Embryo:  Visualized. Cardiac Activity: Evaluation for fetal heart tones is difficult due to small embryo size and patient motion. A possible heart rate of 77 is identified. CRL:  4.6 mm   6 w   1 d                  [place] EDC: February 26, 2023 Subchorionic hemorrhage:  None visualized. Maternal uterus/adnexae: Corpus luteal cyst in the right ovary. IMPRESSION: Single intrauterine gestation is identified without a yolk sac. Evaluation for fetal heart tones is difficult secondary to small embryo size and patient motion.  Possible heart rate of 77 is identified, however despite multiple attempts by the sonographer this can not be confidently identified as an embryonic heart rate over patient's background heart rate or patient motion. Additionally, if this was a fetal heart rate it would be categorized as bradycardia and highly concerning for potential impending fetal demise. Suggest close clinical follow-up and evaluation with beta hCG quantification as well as ultrasound as clinically indicated. Electronically Signed   By: Maudry Mayhew M.D.   On: 07/04/2022 10:43   US OB Comp Less 14 Wks  Result Date: 07/01/2022 Table formatting from the original result was not included.  ..an [employer] Technical sales engineer) accredited practice Center for [facility] [address] [place] [address] Ordering Provider: Lazaro Arms, MD                                                                                 DATING AND VIABILITY SONOGRAM Katie Vargas is a 31 y.o. year old G2P1001  Patient's last menstrual period was 04/29/2022. which would correlate to  [redacted]w[redacted]d weeks gestation.  She has regular menstrual cycles.  She is here today for a confirmatory initial sonogram. GESTATION: SINGLETON   FETAL ACTIVITY:          Heart rate         no fetal heart tones      CERVIX: Appears closed ADNEXA: simple right corpus luteal cyst 2.4 x 2.3 x 2.4 cm,normal left ovary GESTATIONAL AGE AND  BIOMETRICS: Gestational criteria: Estimated Date of Delivery: 02/03/23 by LMP now at [redacted]w[redacted]d Previous Scans:0 GESTATIONAL SAC           22.9 mm         7+2 weeks CROWN RUMP LENGTH           6.59 mm         6+4 weeks                                                                      AVERAGE EGA(BY THIS SCAN):  6+4 weeks WORKING EDD: to be determined  TECHNICIAN COMMENTS: [place] TA/TV:6+4 wks,single IUP with yolk sac,no fetal heart tones visualized,CRL 6.59 mm,GS 22.9 mm,simple right corpus luteal cyst 2.4 x 2.3 x 2.4 cm,normal  left ovary,Katie Vargas discussed results with patient A copy of this report including all images has been saved and backed up to a second source for retrieval if needed. All measures and details of the anatomical scan, placentation, fluid volume and pelvic anatomy are contained in that report. Katie Vargas 06/25/2022 10:32 AM Clinical Impression and recommendations: I have reviewed the sonogram results above, combined with the patient's current clinical course, below are my impressions and any appropriate recommendations for management based on the sonographic findings. IUP seen, no visible heart tones Normal general sonographic findings Concern for early pregnancy vs miscarriage.  Recommend repeat US in 10-14 days Katie Vargas   US OB Transvaginal  Result Date: 07/01/2022 Table formatting from the original result was not included.  ..an [employer] Technical sales engineer) accredited practice Center for [facility] [address] [place] [address] Ordering Provider: Lazaro Arms, MD                                                                                 DATING AND VIABILITY SONOGRAM Katie Vargas is a 31 y.o. year old G2P1001  Patient's last menstrual period was 04/29/2022. which would correlate to  [redacted]w[redacted]d weeks gestation.  She has regular menstrual cycles.   She is here today for a confirmatory initial sonogram. GESTATION: SINGLETON   FETAL ACTIVITY:          Heart rate         no fetal heart tones      CERVIX: Appears closed ADNEXA: simple right corpus luteal cyst 2.4 x 2.3 x 2.4 cm,normal left ovary GESTATIONAL AGE AND  BIOMETRICS: Gestational criteria: Estimated Date of Delivery: 02/03/23 by LMP now at [redacted]w[redacted]d Previous Scans:0 GESTATIONAL SAC  22.9 mm         7+2 weeks CROWN RUMP LENGTH           6.59 mm         6+4 weeks                                                                      AVERAGE EGA(BY THIS SCAN):  6+4 weeks WORKING EDD: to be  determined  TECHNICIAN COMMENTS: [place] TA/TV:6+4 wks,single IUP with yolk sac,no fetal heart tones visualized,CRL 6.59 mm,GS 22.9 mm,simple right corpus luteal cyst 2.4 x 2.3 x 2.4 cm,normal left ovary,Katie Vargas discussed results with patient A copy of this report including all images has been saved and backed up to a second source for retrieval if needed. All measures and details of the anatomical scan, placentation, fluid volume and pelvic anatomy are contained in that report. Katie Heide Guile 06/25/2022 10:32 AM Clinical Impression and recommendations: I have reviewed the sonogram results above, combined with the patient's current clinical course, below are my impressions and any appropriate recommendations for management based on the sonographic findings. IUP seen, no visible heart tones Normal general sonographic findings Concern for early pregnancy vs miscarriage.  Recommend repeat US in 10-14 days [place]:   1. Threatened miscarriage in early pregnancy Discussed very difficult to confirm FH on US examination and there appears to be no interval growth. Not definitively a failed pregnancy at this time but highly unlikely to continue normally. Briefly reviewed management options in case of diagnosed with confirmed missed AB. Repeat US in 2 weeks - pt and husband would prefer to repeat at regular doc office due to distance to [facility]. Reviewed signs/symptoms to prompt presentation to care.    Darliss Cheney, MD [facility] [place]

## 2022-07-04 NOTE — Patient Instructions (Addendum)
We did an ultrasound today and it measured about 6 weeks and a possible slow HR. It was very difficult to see. It may mean that the pregnancy will not continue and lead to a miscarriage.

## 2022-07-18 ENCOUNTER — Encounter: Payer: Self-pay | Admitting: Adult Health

## 2022-07-18 ENCOUNTER — Ambulatory Visit (INDEPENDENT_AMBULATORY_CARE_PROVIDER_SITE_OTHER): Payer: BC Managed Care – PPO

## 2022-07-18 ENCOUNTER — Ambulatory Visit: Payer: BC Managed Care – PPO | Admitting: Adult Health

## 2022-07-18 VITALS — BP 120/81 | HR 91 | Ht 61.5 in | Wt 235.0 lb

## 2022-07-18 DIAGNOSIS — Z3A11 11 weeks gestation of pregnancy: Secondary | ICD-10-CM

## 2022-07-18 DIAGNOSIS — O200 Threatened abortion: Secondary | ICD-10-CM | POA: Diagnosis not present

## 2022-07-18 DIAGNOSIS — O039 Complete or unspecified spontaneous abortion without complication: Secondary | ICD-10-CM

## 2022-07-18 NOTE — Progress Notes (Signed)
  Subjective:     Patient ID: Katie Vargas, female   DOB: November 12, 1990, 31 y.o.   MRN: [id number]  HPI Katie Vargas is a 31 year old white female,married, G2P1, in for a [place] to assess for miscarriage, has been bleeding and passed tissue Sunday and Monday. Blood type is O+.     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   ADEQPAP  12/23/2019 1624    Satisfactory for evaluation; transformation zone component PRESENT.   PCP is Dr Toya Smothers Review of Systems +bleeding No pain now Reviewed past medical,surgical, social and family history. Reviewed medications and allergies.     Objective:   Physical Exam BP 120/81 (BP Location: Left Arm, Patient Position: Sitting, Cuff Size: Normal)   Pulse 91   Ht 5' 1.5" (1.562 m)   Wt 235 lb (106.6 kg)   LMP  (LMP Unknown)   Breastfeeding Unknown   BMI 43.68 kg/m     Talk only. US showed no IUP.   Assessment:     1. Miscarriage Complete, no IUP on [place] Will check QHCG in am She is wanting to lose weight, try WW and walking about 30 minutes every day  Continue PNV Gave number for urology if husband wants SA     Plan:     Follow up in am

## 2022-07-18 NOTE — Progress Notes (Signed)
PELVIC US TV: no IUP visualized,EEC 17 mm,normal ovaries,no free fluid,no pain during ultrasound,[name] discussed results with patient  Chaperone Tish

## 2022-07-19 ENCOUNTER — Other Ambulatory Visit: Payer: BC Managed Care – PPO

## 2022-10-16 ENCOUNTER — Encounter: Payer: Self-pay | Admitting: Orthopedic Surgery

## 2022-10-28 ENCOUNTER — Telehealth: Payer: Self-pay | Admitting: Orthopedic Surgery

## 2022-10-28 NOTE — Telephone Encounter (Signed)
Patient called in pain and crying, she wanted an earlier appointment.  I offered her an appointment with Dr. Luna Glasgow for this Thursday.  She declined.  Gave her the [place] number, she is going to see if she can get in there any sooner.

## 2022-10-31 ENCOUNTER — Ambulatory Visit: Payer: BC Managed Care – PPO | Admitting: Physician Assistant

## 2022-10-31 ENCOUNTER — Encounter: Payer: Self-pay | Admitting: Radiology

## 2022-11-01 ENCOUNTER — Ambulatory Visit: Payer: BC Managed Care – PPO | Admitting: Orthopedic Surgery

## 2022-11-08 ENCOUNTER — Ambulatory Visit: Payer: BC Managed Care – PPO | Admitting: Orthopedic Surgery

## 2022-11-08 ENCOUNTER — Encounter: Payer: Self-pay | Admitting: Orthopedic Surgery

## 2022-11-08 VITALS — BP 106/73 | HR 87 | Ht 61.5 in | Wt 230.0 lb

## 2022-11-08 DIAGNOSIS — M7501 Adhesive capsulitis of right shoulder: Secondary | ICD-10-CM

## 2022-11-08 DIAGNOSIS — M7530 Calcific tendinitis of unspecified shoulder: Secondary | ICD-10-CM

## 2022-11-08 MED ORDER — MELOXICAM 15 MG PO TABS: 15.0000 mg | ORAL_TABLET | Freq: Every day | ORAL | 0 refills | Status: DC

## 2022-11-08 NOTE — Progress Notes (Signed)
New Patient Visit  Assessment: Katie Vargas is a 32 y.o. female with the following: 1. Calcific bursitis of shoulder 2. Adhesive capsulitis of right shoulder  Plan: [name] has pain in her right shoulder.  No specific injury.  She has known calcium deposits within the rotator cuff tendons.  At this point, I do not think that the calcium deposits will be causing all of her discomfort.  She has severely restricted range of motion.  This is more consistent with adhesive capsulitis.  We discussed proceeding with a high-volume steroid injection, and she elected to proceed.  This was completed with ultrasound today.  She was monitored in clinic for several minutes after the injection, and was deemed fit to go home.  She will contact the clinic if she has further issues.  She will pay attention to the efficacy of the injection.  It is possible that some of her symptoms could be related to her neck.  We may have to discuss this further if she returns to clinic.  All questions were answered, and she is in agreement with this plan.  Procedure note injection - Right shoulder, ultrasound guidance   Verbal consent was obtained to inject the Right shoulder, glenohumeral joint  Timeout was completed to confirm the site of injection.   Using the ultrasound, the rotator cuff tendons were identified.  The joint space was also identified. The skin was prepped with alcohol and ethyl chloride was sprayed at the injection site.  A 21-gauge needle was used to inject 40 mg of Depo-Medrol and 1% lidocaine (5 cc) into the glenohumeral joint space of the Right shoulder using a posterolateral approach.  The needle was visualized entering the glenohumeral joint, and the medication was also visualized. There were no complications.  A sterile bandage was applied.   Note: In order to accurately identify the placement of the needle, ultrasound was required, to increase the accuracy, and specificity of the  injection.    Follow-up: Return if symptoms worsen or fail to improve.  Subjective:  Chief Complaint  Patient presents with   Shoulder Pain    Right shoulder pain  NDC [phone]    History of Present Illness: Katie Vargas is a 32 y.o. female who presents for evaluation of right shoulder pain.  She has had progressively worsening right shoulder pain for the past 1-2 months.  No specific injury.  She does note that she has known calcific tendinitis.  She has had flares in the past.  However, this pain seems to be more severe.  She has been to the emergency department a couple of times.  She has had prednisone Dosepak some more than 1 occasion.  Her pain improves with the Dosepak, but subsequently returns.  She has had a steroid injection, intramuscular, which may have helped.  She states that her pain is much better today than it has been recently.  No injection in her joint.  She does have occasional shooting pains through her right shoulder, and into her right hand.   Review of Systems: No fevers or chills No numbness or tingling No chest pain No shortness of breath No bowel or bladder dysfunction No GI distress No headaches   Medical History:  Past Medical History:  Diagnosis Date   Anxiety    Depression    Vaginal Pap smear, abnormal     Past Surgical History:  Procedure Laterality Date   CESAREAN SECTION  2011   MOUTH SURGERY     teeth removed  Family History  Problem Relation Age of Onset   Other Maternal Grandfather        heart issues   Mental illness Father    Mental illness Mother    Social History   Tobacco Use   Smoking status: Every Day    Types: E-cigarettes   Smokeless tobacco: Never  Vaping Use   Vaping Use: Every day  Substance Use Topics   Alcohol use: No   Drug use: No    Allergies  Allergen Reactions   Penicillins     Side affects     Current Meds  Medication Sig   hydrochlorothiazide (MICROZIDE) 12.5 MG capsule Take 1  capsule (12.5 mg total) by mouth daily.   meloxicam (MOBIC) 15 MG tablet Take 1 tablet (15 mg total) by mouth daily.    Objective: BP 106/73   Pulse 87   Ht 5' 1.5" (1.562 m)   Wt 230 lb (104.3 kg)   BMI 42.75 kg/m   Physical Exam:  General: Alert and oriented. and No acute distress. Gait: Normal gait.  Right shoulder without deformity.  No redness.  No bruising.  Tenderness to palpation laterally.  Forward flexion limited to 70 degrees.  Passively I can get her to 100 degrees, with obvious discomfort.  Severe restricted external rotation.  She is unable to get her hand to her lower back.  Fingers are warm and well perfused.  2+ radial pulse.  Sensation intact throughout the right hand    IMAGING: I personally reviewed images previously obtained in clinic  Previous x-rays and an MRI from 2019 were available in clinic today.  There is a large calcium deposit within the lateral aspect of the rotator cuff tendons.  No additional injuries are noted.   New Medications:  Meds ordered this encounter  Medications   meloxicam (MOBIC) 15 MG tablet    Sig: Take 1 tablet (15 mg total) by mouth daily.    Dispense:  30 tablet    Refill:  0      Mordecai Rasmussen, MD  11/08/2022 9:46 PM

## 2022-11-08 NOTE — Patient Instructions (Signed)

## 2022-11-12 ENCOUNTER — Encounter: Payer: Self-pay | Admitting: Orthopedic Surgery

## 2022-12-11 ENCOUNTER — Ambulatory Visit: Payer: BC Managed Care – PPO | Admitting: Adult Health

## 2022-12-12 ENCOUNTER — Ambulatory Visit: Payer: BC Managed Care – PPO | Admitting: Adult Health

## 2022-12-26 ENCOUNTER — Ambulatory Visit: Payer: BC Managed Care – PPO | Admitting: Adult Health

## 2022-12-26 ENCOUNTER — Encounter: Payer: Self-pay | Admitting: Adult Health

## 2022-12-26 VITALS — BP 121/77 | HR 86 | Ht 61.5 in | Wt 236.5 lb

## 2022-12-26 DIAGNOSIS — F32A Depression, unspecified: Secondary | ICD-10-CM

## 2022-12-26 DIAGNOSIS — O3680X0 Pregnancy with inconclusive fetal viability, not applicable or unspecified: Secondary | ICD-10-CM

## 2022-12-26 DIAGNOSIS — O09299 Supervision of pregnancy with other poor reproductive or obstetric history, unspecified trimester: Secondary | ICD-10-CM | POA: Diagnosis not present

## 2022-12-26 DIAGNOSIS — H9209 Otalgia, unspecified ear: Secondary | ICD-10-CM

## 2022-12-26 DIAGNOSIS — Z8759 Personal history of other complications of pregnancy, childbirth and the puerperium: Secondary | ICD-10-CM | POA: Insufficient documentation

## 2022-12-26 DIAGNOSIS — Z3A08 8 weeks gestation of pregnancy: Secondary | ICD-10-CM

## 2022-12-26 DIAGNOSIS — Z3201 Encounter for pregnancy test, result positive: Secondary | ICD-10-CM

## 2022-12-26 DIAGNOSIS — J029 Acute pharyngitis, unspecified: Secondary | ICD-10-CM | POA: Diagnosis not present

## 2022-12-26 DIAGNOSIS — O219 Vomiting of pregnancy, unspecified: Secondary | ICD-10-CM | POA: Insufficient documentation

## 2022-12-26 DIAGNOSIS — F419 Anxiety disorder, unspecified: Secondary | ICD-10-CM

## 2022-12-26 LAB — POCT URINE PREGNANCY: Preg Test, Ur: POSITIVE — AB

## 2022-12-26 MED ORDER — ESCITALOPRAM OXALATE 10 MG PO TABS: 10.0000 mg | ORAL_TABLET | Freq: Every day | ORAL | 2 refills | Status: DC

## 2022-12-26 MED ORDER — PROMETHAZINE HCL 12.5 MG PO TABS: 12.5000 mg | ORAL_TABLET | Freq: Four times a day (QID) | ORAL | 1 refills | Status: DC | PRN

## 2022-12-26 MED ORDER — CEPHALEXIN 500 MG PO CAPS: 500.0000 mg | ORAL_CAPSULE | Freq: Three times a day (TID) | ORAL | 0 refills | Status: DC

## 2022-12-26 NOTE — Progress Notes (Signed)
Subjective:     Patient ID: Katie Vargas, female   DOB: 12/20/1990, 32 y.o.   MRN: [id number]  HPI Rosalinda is a 32 year old white female, married, G3P1011 in for UPT, pregnancy confirmation. Has had 2 +HPTs, she had miscarriage in November. She has sore throat and left ear hurts and requests nausea meds and lexapro. She is a Runner, broadcasting/film/video.     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   ADEQPAP  12/23/2019 1624    Satisfactory for evaluation; transformation zone component PRESENT.   PCP is Cassell Clement MD   Review of Systems Missed period +HPTs +nausea +sore throat and ear ache, took Z pack with out relief +depression Reviewed past medical,surgical, social and family history. Reviewed medications and allergies.     Objective:   Physical Exam BP 121/77 (BP Location: Left Arm, Patient Position: Sitting, Cuff Size: Large)   Pulse 86   Ht 5' 1.5" (1.562 m)   Wt 236 lb 8 oz (107.3 kg)   LMP 10/25/2022   Breastfeeding No   BMI 43.96 kg/m  UPT is +, about 8+6 weeks by LMP with EDD 08/01/23. Skin warm and dry. Neck: mid line trachea, normal thyroid, good ROM, no lymphadenopathy noted. Lungs: clear to ausculation bilaterally. Cardiovascular: regular rate and rhythm. Throat is red, right ear is redder than left Fall risk is low    12/26/2022    3:32 PM 03/28/2021    4:08 PM 11/24/2020    1:15 PM  Depression screen PHQ 2/9  Decreased Interest 1 1 0  Down, Depressed, Hopeless 1 0 1  PHQ - 2 Score Altered sleeping Tired, decreased energy Change in appetite 0 0 2  Feeling bad or  failure about yourself  2 0 0  Trouble concentrating Moving slowly or fidgety/restless 1 0 3  Suicidal thoughts 1 0 0  PHQ-9 Score Difficult doing work/chores Very difficult         12/26/2022    3:34 PM 03/28/2021    4:08 PM 12/23/2019    4:20 PM  GAD 7 : Generalized Anxiety Score  Nervous, Anxious, on Edge 0 1 2  Control/stop worrying 1 0 2  Worry too much - different things Trouble relaxing Restless 0 1 0  Easily annoyed or irritable Afraid - awful might happen 3 0 0  Total GAD 7 Score Anxiety Difficulty   Very difficult      Upstream - 12/26/22 1520       Pregnancy Intention Screening   Does the patient want to become pregnant in the next year? N/A    Does the patient's partner want to become pregnant in the next year? N/A    Would the patient like to discuss contraceptive options today? N/A      Contraception Wrap Up   Current Method Pregnant/Seeking Pregnancy    End Method Pregnant/Seeking Pregnancy    Contraception Counseling Provided No  Assessment:     1. Pregnancy examination or test, positive result - POCT urine pregnancy  2. [redacted] weeks gestation of pregnancy Take PNV  3. Encounter to determine fetal viability of pregnancy, single or unspecified fetus Dating [place] in about 3 weeks and get IT/NT then she declines earlier [place]  4. Sore throat +throat is red Use warm salt water gargles  Suck on hard candy Will rx keflex  5. Ear ache Will rx keflex  6. Nausea and vomiting during pregnancy prior to [redacted] weeks gestation Wil rx phenergan 12.5 mg   7. Anxiety and depression Will rx lexarpo 10 mg 1 daily  Meds ordered this encounter  Medications   promethazine (PHENERGAN) 12.5 MG tablet    Sig: Take 1 tablet (12.5 mg total) by mouth every 6 (six) hours as needed.    Dispense:  30 tablet    Refill:  1    Order Specific Question:   Supervising Provider    Answer:   Despina Hidden, LUTHER H [[id number]]    escitalopram (LEXAPRO) 10 MG tablet    Sig: Take 1 tablet (10 mg total) by mouth daily.    Dispense:  30 tablet    Refill:  2    Order Specific Question:   Supervising Provider    Answer:   Duane Lope H [[id number]]   cephALEXin (KEFLEX) 500 MG capsule    Sig: Take 1 capsule (500 mg total) by mouth 3 (three) times daily.    Dispense:  21 capsule    Refill:  0    Order Specific Question:   Supervising Provider    Answer:   Despina Hidden, LUTHER H [[id number]]    8. History of miscarriage, currently pregnant      Plan:    She is going to apply for pregnancy medicaid Return about 01/17/23 for dating [place] and IT/NT Review OB packet

## 2023-01-17 ENCOUNTER — Other Ambulatory Visit: Payer: Self-pay | Admitting: Adult Health

## 2023-01-17 DIAGNOSIS — O3680X0 Pregnancy with inconclusive fetal viability, not applicable or unspecified: Secondary | ICD-10-CM

## 2023-01-20 ENCOUNTER — Encounter: Payer: BC Managed Care – PPO | Admitting: Women's Health

## 2023-01-20 ENCOUNTER — Encounter: Payer: BC Managed Care – PPO | Admitting: *Deleted

## 2023-01-20 ENCOUNTER — Encounter: Payer: Self-pay | Admitting: Women's Health

## 2023-01-20 ENCOUNTER — Other Ambulatory Visit: Payer: Self-pay | Admitting: Adult Health

## 2023-01-20 ENCOUNTER — Ambulatory Visit (INDEPENDENT_AMBULATORY_CARE_PROVIDER_SITE_OTHER): Payer: BC Managed Care – PPO | Admitting: Women's Health

## 2023-01-20 ENCOUNTER — Ambulatory Visit (INDEPENDENT_AMBULATORY_CARE_PROVIDER_SITE_OTHER): Payer: BC Managed Care – PPO

## 2023-01-20 VITALS — BP 128/85 | HR 98 | Ht 61.2 in | Wt 237.0 lb

## 2023-01-20 DIAGNOSIS — O30041 Twin pregnancy, dichorionic/diamniotic, first trimester: Secondary | ICD-10-CM | POA: Diagnosis not present

## 2023-01-20 DIAGNOSIS — O020 Blighted ovum and nonhydatidiform mole: Secondary | ICD-10-CM

## 2023-01-20 DIAGNOSIS — Z3A12 12 weeks gestation of pregnancy: Secondary | ICD-10-CM | POA: Diagnosis not present

## 2023-01-20 DIAGNOSIS — O3680X0 Pregnancy with inconclusive fetal viability, not applicable or unspecified: Secondary | ICD-10-CM

## 2023-01-20 NOTE — Progress Notes (Signed)
   GYN VISIT Patient name: Katie Vargas MRN [id number]  Date of birth: Apr 30, 1991 Chief Complaint:   Follow-up (U/s)  History of Present Illness:   Katie Vargas is a 32 y.o. G1P1011 Caucasian female at [redacted]w[redacted]d by LMP, being seen today initially for new ob, however u/s reveals DCDA twin gestation w/ GS A measuring 43.75mm=9+6wk, no fetal pole or yolk sac and GS B measuring 33.4mm=8+4wks, no fetal pole or yolk sac, diagnostic for failed pregnancy.  No cramping/bleeding.   Patient's last menstrual period was 10/25/2022.  Review of Systems:   Pertinent items are noted in HPI Denies fever/chills, dizziness, headaches, visual disturbances, fatigue, shortness of breath, chest pain, abdominal pain, vomiting, abnormal vaginal discharge/itching/odor/irritation, problems with periods, bowel movements, urination, or intercourse unless otherwise stated above.  Pertinent History Reviewed:  Reviewed past medical,surgical, social, obstetrical and family history.  Reviewed problem list, medications and allergies. Physical Assessment:   Vitals:   01/20/23 1458  BP: 128/85  Pulse: 98  Weight: 237 lb (107.5 kg)  Height: 5' 1.2" (1.554 m)  Body mass index is 44.49 kg/m.       Physical Examination:   General appearance: alert, well appearing, and in no distress  Mental status: alert, oriented to person, place, and time  Skin: warm & dry   Cardiovascular: normal heart rate noted  Respiratory: normal respiratory effort, no distress  Abdomen: soft, non-tender   Pelvic: examination not indicated  Extremities: no edema   Chaperone: N/A    No results found for this or any previous visit (from the past 24 hour(s)).  Assessment & Plan:  1) DCDA blighted ovum> discussed w/ L[facility], recommends D&C- will try to add her on this Wed- contacted OR. Will let pt know via MyChart. Rh+  Meds: No orders of the defined types were placed in this encounter.   No orders of the defined types were placed in this  encounter.   Return for Dr. Despina Hidden will call w/ next f/u.  Cheral Marker CNM, [facility] 01/20/2023 4:47 PM

## 2023-01-20 NOTE — Progress Notes (Signed)
[place] TWIN GS DC/DA,normal ovaries,limited view of left ovary GS A: 43.6 mm=9+6 wks,no fetal pole or yolk sac visualized GS B:  33.9 mm=8+4 wks,no fetal pole or yolk sac visualized

## 2023-01-21 ENCOUNTER — Encounter: Payer: Self-pay | Admitting: Obstetrics & Gynecology

## 2023-01-21 ENCOUNTER — Encounter (HOSPITAL_COMMUNITY): Payer: Self-pay

## 2023-01-21 ENCOUNTER — Encounter (HOSPITAL_COMMUNITY)
Admission: RE | Admit: 2023-01-21 | Discharge: 2023-01-21 | Disposition: A | Discharge status: Home / Self Care | Payer: BC Managed Care – PPO | Source: Ambulatory Visit | Attending: Obstetrics & Gynecology | Admitting: Obstetrics & Gynecology

## 2023-01-21 ENCOUNTER — Other Ambulatory Visit: Payer: Self-pay | Admitting: Obstetrics & Gynecology

## 2023-01-21 DIAGNOSIS — Z01812 Encounter for preprocedural laboratory examination: Secondary | ICD-10-CM | POA: Insufficient documentation

## 2023-01-21 DIAGNOSIS — Z87891 Personal history of nicotine dependence: Secondary | ICD-10-CM | POA: Diagnosis not present

## 2023-01-21 DIAGNOSIS — Z6841 Body Mass Index (BMI) 40.0 and over, adult: Secondary | ICD-10-CM | POA: Diagnosis not present

## 2023-01-21 DIAGNOSIS — O021 Missed abortion: Secondary | ICD-10-CM | POA: Diagnosis present

## 2023-01-21 DIAGNOSIS — Z01818 Encounter for other preprocedural examination: Secondary | ICD-10-CM

## 2023-01-21 LAB — CBC
HCT: 35.1 % — ABNORMAL LOW (ref 36.0–46.0)
Hemoglobin: 11.3 g/dL — ABNORMAL LOW (ref 12.0–15.0)
MCH: 25.7 pg — ABNORMAL LOW (ref 26.0–34.0)
MCHC: 32.2 g/dL (ref 30.0–36.0)
MCV: 79.8 fL — ABNORMAL LOW (ref 80.0–100.0)
Platelets: 321 10*3/uL (ref 150–400)
RBC: 4.4 MIL/uL (ref 3.87–5.11)
RDW: 13.8 % (ref 11.5–15.5)
WBC: 8.4 10*3/uL (ref 4.0–10.5)
nRBC: 0 % (ref 0.0–0.2)

## 2023-01-21 LAB — TYPE AND SCREEN
ABO/RH(D): O POS
Antibody Screen: NEGATIVE

## 2023-01-21 LAB — URINALYSIS, ROUTINE W REFLEX MICROSCOPIC
Bilirubin Urine: NEGATIVE
Glucose, UA: NEGATIVE mg/dL
Ketones, ur: NEGATIVE mg/dL
Leukocytes,Ua: NEGATIVE
Nitrite: NEGATIVE
Protein, ur: 30 mg/dL — AB
Specific Gravity, Urine: 1.021 (ref 1.005–1.030)
pH: 5 (ref 5.0–8.0)

## 2023-01-21 LAB — COMPREHENSIVE METABOLIC PANEL
ALT: 18 U/L (ref 0–44)
AST: 21 U/L (ref 15–41)
Albumin: 3.2 g/dL — ABNORMAL LOW (ref 3.5–5.0)
Alkaline Phosphatase: 52 U/L (ref 38–126)
Anion gap: 9 (ref 5–15)
BUN: 7 mg/dL (ref 6–20)
CO2: 21 mmol/L — ABNORMAL LOW (ref 22–32)
Calcium: 8.4 mg/dL — ABNORMAL LOW (ref 8.9–10.3)
Chloride: 104 mmol/L (ref 98–111)
Creatinine, Ser: 0.62 mg/dL (ref 0.44–1.00)
GFR, Estimated: >60 mL/min (ref >60)
Glucose, Bld: 93 mg/dL (ref 70–99)
Potassium: 3.3 mmol/L — ABNORMAL LOW (ref 3.5–5.1)
Sodium: 134 mmol/L — ABNORMAL LOW (ref 135–145)
Total Bilirubin: 0.3 mg/dL (ref 0.3–1.2)
Total Protein: 7.4 g/dL (ref 6.5–8.1)

## 2023-01-21 LAB — RAPID HIV SCREEN (HIV 1/2 AB+AG)
HIV 1/2 Antibodies: NONREACTIVE
HIV-1 P24 Antigen - HIV24: NONREACTIVE

## 2023-01-21 NOTE — Patient Instructions (Signed)
Katie Vargas  01/21/2023     @PREFPERIOPPHARMACY @   Your procedure is scheduled on  01/21/2022.   Report to [facility] at  [phone] A.M.   Call this number if you have problems the morning of surgery:  [phone]  If you experience any cold or flu symptoms such as cough, fever, chills, shortness of breath, etc. between now and your scheduled surgery, please notify us at the above number.   Remember:  Do not eat after midnight.   You may drink clear liquids until  0650 am on 01/22/2023.       Clear liquids allowed are:                    Water, Juice (No red color; non-citric and without pulp; diabetics please choose diet or no sugar options), Carbonated beverages (diabetics please choose diet or no sugar options), Clear Tea (No creamer, milk, or cream, including half & half and powdered creamer), Black Coffee Only (No creamer, milk or cream, including half & half and powdered creamer), Plain Jell-O Only (No red color; diabetics please choose no sugar options), Clear Sports drink (No red color; diabetics please choose diet or no sugar options), and Plain Popsicles Only (No red color; diabetics please choose no sugar options)       At 0650 am on 01/22/2023 drink your carb drink. You can have nothing else to drink after this.    Take these medicines the morning of surgery with A SIP OF WATER                                                 None.    Do not wear jewelry, make-up or nail polish.  Do not wear lotions, powders, or perfumes, or deodorant.  Do not shave 48 hours prior to surgery.  Men may shave face and neck.  Do not bring valuables to the hospital.  [facility] is not responsible for any belongings or valuables.  Contacts, dentures or bridgework may not be worn into surgery.  Leave your suitcase in the car.  After surgery it may be brought to your room.  For patients admitted to the hospital, discharge time will be determined by your treatment team.  Patients  discharged the day of surgery will not be allowed to drive home and must have someone with them for 24 hours.    Special instructions:   DO NOT smoke tobacco or vape for 24 hours before your procedure.  Please read over the following fact sheets that you were given. Pain Booklet, Coughing and Deep Breathing, Surgical Site Infection Prevention, Anesthesia Post-op Instructions, and Care and Recovery After Surgery      Dilation and Curettage or Vacuum Curettage, Care After The following information offers guidance on how to care for yourself after your procedure. Your doctor may also give you more specific instructions. If you have problems or questions, contact your doctor. What can I expect after the procedure? After the procedure, it is common to have: Mild pain or cramps. Some bleeding or spotting from the vagina. These may last for up to 2 weeks. Follow these instructions at home: Medicines Take over-the-counter and prescription medicines only as told by your doctor. If told, take steps to prevent problems with pooping (constipation). You may need to: Drink  enough fluid to keep your pee (urine) pale yellow. Take medicines. You will be told what medicines to take. Eat foods that are high in fiber. These include beans, whole grains, and fresh fruits and vegetables. Limit foods that are high in fat and sugar. These include fried or sweet foods. Ask your doctor if you should avoid driving or using machines while you are taking your medicine. Activity  If you were given a medicine to help you relax (sedative) during your procedure, it can affect you for many hours. Do not drive or use machinery until your doctor says that it is safe. Rest as told by your doctor. Get up to take short walks every 1-2 hours. Ask for help if you feel weak or unsteady. Do not lift anything that is heavier than 10 lb (4.5 kg), or the limit that you are told. Return to your normal activities when your doctor  says that it is safe. Lifestyle For at least 2 weeks, or as long as told by your doctor: Do not douche. Do not use tampons. Do not have sex. General instructions Do not take baths, swim, or use a hot tub. Ask your doctor if you may take showers. Do not smoke or use any products that contain nicotine or tobacco. These can delay healing. If you need help quitting, ask your doctor. Wear compression stockings as told by your doctor. It is up to you to get the results of your procedure. Ask how to get your results when they are ready. Keep all follow-up visits. Contact a doctor if: You have very bad cramps that get worse or do not get better with medicine. You have very bad pain in your belly (abdomen). You cannot drink fluids without vomiting. You have pain in the area just above your thighs. You have fluid from your vagina that smells bad. You have a rash. Get help right away if: You are bleeding a lot from your vagina. This means soaking more than one sanitary pad in 1 hour, and this happens for 2 hours in a row. You have a fever that is above 100.55F (38C). Your belly feels very tender or hard. You have chest pain. You have trouble breathing. You feel dizzy or light-headed. You faint. You have pain in your neck or shoulder area. These symptoms may be an emergency. Get help right away. Call your local emergency services (911 in the [place] Do not wait to see if the symptoms will go away. Do not drive yourself to the hospital. Summary After your procedure, it is common to have pain or cramping. It is also common to have bleeding or spotting from your vagina. Rest as told. Get up to take short walks every 1-2 hours. Do not lift anything that is heavier than 10 lb (4.5 kg), or the limit that you are told. Get help right away if you have problems from the procedure. Ask your doctor what problems to watch for. This information is not intended to replace advice given to you by your health  care provider. Make sure you discuss any questions you have with your health care provider. Document Revised: 08/09/2020 Document Reviewed: 08/09/2020 Elsevier Patient Education  2023 [employer] General Anesthesia, Adult, Care After The following information offers guidance on how to care for yourself after your procedure. Your health care provider may also give you more specific instructions. If you have problems or questions, contact your health care provider. What can I expect after the procedure? After the procedure,  it is common for people to: Have pain or discomfort at the IV site. Have nausea or vomiting. Have a sore throat or hoarseness. Have trouble concentrating. Feel cold or chills. Feel weak, sleepy, or tired (fatigue). Have soreness and body aches. These can affect parts of the body that were not involved in surgery. Follow these instructions at home: For the time period you were told by your health care provider:  Rest. Do not participate in activities where you could fall or become injured. Do not drive or use machinery. Do not drink alcohol. Do not take sleeping pills or medicines that cause drowsiness. Do not make important decisions or sign legal documents. Do not take care of children on your own. General instructions Drink enough fluid to keep your urine pale yellow. If you have sleep apnea, surgery and certain medicines can increase your risk for breathing problems. Follow instructions from your health care provider about wearing your sleep device: Anytime you are sleeping, including during daytime naps. While taking prescription pain medicines, sleeping medicines, or medicines that make you drowsy. Return to your normal activities as told by your health care provider. Ask your health care provider what activities are safe for you. Take over-the-counter and prescription medicines only as told by your health care provider. Do not use any products that contain  nicotine or tobacco. These products include cigarettes, chewing tobacco, and vaping devices, such as e-cigarettes. These can delay incision healing after surgery. If you need help quitting, ask your health care provider. Contact a health care provider if: You have nausea or vomiting that does not get better with medicine. You vomit every time you eat or drink. You have pain that does not get better with medicine. You cannot urinate or have bloody urine. You develop a skin rash. You have a fever. Get help right away if: You have trouble breathing. You have chest pain. You vomit blood. These symptoms may be an emergency. Get help right away. Call 911. Do not wait to see if the symptoms will go away. Do not drive yourself to the hospital. Summary After the procedure, it is common to have a sore throat, hoarseness, nausea, vomiting, or to feel weak, sleepy, or fatigue. For the time period you were told by your health care provider, do not drive or use machinery. Get help right away if you have difficulty breathing, have chest pain, or vomit blood. These symptoms may be an emergency. This information is not intended to replace advice given to you by your health care provider. Make sure you discuss any questions you have with your health care provider. Document Revised: 11/16/2021 Document Reviewed: 11/16/2021 Elsevier Patient Education  2023 [employer].

## 2023-01-21 NOTE — Progress Notes (Signed)
This encounter was created in error - please disregard. Scheduled as new ob, but switched to gyn d/t blighted ovum.

## 2023-01-22 ENCOUNTER — Encounter (HOSPITAL_COMMUNITY)
Admission: RE | Disposition: A | Discharge status: Home / Self Care | Payer: Self-pay | Source: Home / Self Care | Attending: Obstetrics & Gynecology

## 2023-01-22 ENCOUNTER — Ambulatory Visit (HOSPITAL_COMMUNITY): Payer: BC Managed Care – PPO | Admitting: Anesthesiology

## 2023-01-22 ENCOUNTER — Ambulatory Visit (HOSPITAL_COMMUNITY)
Admission: RE | Admit: 2023-01-22 | Discharge: 2023-01-22 | Disposition: A | Discharge status: Home / Self Care | Payer: BC Managed Care – PPO | Attending: Obstetrics & Gynecology | Admitting: Obstetrics & Gynecology

## 2023-01-22 ENCOUNTER — Encounter (HOSPITAL_COMMUNITY): Payer: Self-pay | Admitting: Obstetrics & Gynecology

## 2023-01-22 ENCOUNTER — Other Ambulatory Visit: Payer: Self-pay

## 2023-01-22 DIAGNOSIS — O021 Missed abortion: Secondary | ICD-10-CM

## 2023-01-22 DIAGNOSIS — Z87891 Personal history of nicotine dependence: Secondary | ICD-10-CM | POA: Insufficient documentation

## 2023-01-22 DIAGNOSIS — Z3A12 12 weeks gestation of pregnancy: Secondary | ICD-10-CM

## 2023-01-22 DIAGNOSIS — Z6841 Body Mass Index (BMI) 40.0 and over, adult: Secondary | ICD-10-CM | POA: Insufficient documentation

## 2023-01-22 DIAGNOSIS — N96 Recurrent pregnancy loss: Secondary | ICD-10-CM

## 2023-01-22 DIAGNOSIS — Z01818 Encounter for other preprocedural examination: Secondary | ICD-10-CM

## 2023-01-22 DIAGNOSIS — O020 Blighted ovum and nonhydatidiform mole: Secondary | ICD-10-CM

## 2023-01-22 HISTORY — PX: DILATION AND CURETTAGE OF UTERUS: SHX78

## 2023-01-22 LAB — RPR: RPR Ser Ql: NONREACTIVE

## 2023-01-22 SURGERY — DILATION AND CURETTAGE
Anesthesia: General | Site: Vagina

## 2023-01-22 MED ORDER — MIDAZOLAM HCL 2 MG/2ML IJ SOLN
INTRAMUSCULAR | Status: DC | PRN
  Administered 2023-01-22: 2 mg via INTRAVENOUS

## 2023-01-22 MED ORDER — ORAL CARE MOUTH RINSE: 15.0000 mL | Freq: Once | OROMUCOSAL | Status: AC

## 2023-01-22 MED ORDER — MISOPROSTOL 200 MCG PO TABS: 400.0000 ug | ORAL_TABLET | Freq: Three times a day (TID) | ORAL | 0 refills | Status: DC

## 2023-01-22 MED ORDER — PROPOFOL 10 MG/ML IV BOLUS
INTRAVENOUS | Status: DC | PRN
  Administered 2023-01-22: 200 mg via INTRAVENOUS

## 2023-01-22 MED ORDER — FENTANYL CITRATE (PF) 100 MCG/2ML IJ SOLN
INTRAMUSCULAR | Status: AC
  Filled 2023-01-22: qty 2

## 2023-01-22 MED ORDER — HYDROCODONE-ACETAMINOPHEN 5-325 MG PO TABS: 1.0000 | ORAL_TABLET | Freq: Four times a day (QID) | ORAL | 0 refills | Status: DC | PRN

## 2023-01-22 MED ORDER — MIDAZOLAM HCL 2 MG/2ML IJ SOLN
INTRAMUSCULAR | Status: AC
  Filled 2023-01-22: qty 2

## 2023-01-22 MED ORDER — KETOROLAC TROMETHAMINE 30 MG/ML IJ SOLN
30.0000 mg | Freq: Once | INTRAMUSCULAR | Status: AC
  Administered 2023-01-22: 30 mg via INTRAVENOUS
  Filled 2023-01-22: qty 1

## 2023-01-22 MED ORDER — METHYLERGONOVINE MALEATE 0.2 MG/ML IJ SOLN
INTRAMUSCULAR | Status: DC | PRN
  Administered 2023-01-22: .2 mg via INTRAMUSCULAR

## 2023-01-22 MED ORDER — TRANEXAMIC ACID-NACL 1000-0.7 MG/100ML-% IV SOLN
INTRAVENOUS | Status: AC
  Filled 2023-01-22: qty 100

## 2023-01-22 MED ORDER — FENTANYL CITRATE (PF) 100 MCG/2ML IJ SOLN
INTRAMUSCULAR | Status: DC | PRN
  Administered 2023-01-22: 50 ug via INTRAVENOUS

## 2023-01-22 MED ORDER — DEXAMETHASONE SODIUM PHOSPHATE 10 MG/ML IJ SOLN
INTRAMUSCULAR | Status: DC | PRN
  Administered 2023-01-22: 10 mg via INTRAVENOUS

## 2023-01-22 MED ORDER — SUCCINYLCHOLINE CHLORIDE 200 MG/10ML IV SOSY
PREFILLED_SYRINGE | INTRAVENOUS | Status: DC | PRN
  Administered 2023-01-22: 160 mg via INTRAVENOUS

## 2023-01-22 MED ORDER — MEPERIDINE HCL 50 MG/ML IJ SOLN: 6.2500 mg | INTRAMUSCULAR | Status: DC | PRN

## 2023-01-22 MED ORDER — OXYTOCIN 10 UNIT/ML IJ SOLN
INTRAMUSCULAR | Status: AC
  Filled 2023-01-22: qty 1

## 2023-01-22 MED ORDER — PROPOFOL 10 MG/ML IV BOLUS
INTRAVENOUS | Status: AC
  Filled 2023-01-22: qty 20

## 2023-01-22 MED ORDER — POVIDONE-IODINE 10 % EX SWAB: 2.0000 | Freq: Once | CUTANEOUS | Status: DC

## 2023-01-22 MED ORDER — CEFAZOLIN SODIUM-DEXTROSE 2-4 GM/100ML-% IV SOLN
2.0000 g | INTRAVENOUS | Status: AC
  Administered 2023-01-22: 2 g via INTRAVENOUS
  Filled 2023-01-22: qty 100

## 2023-01-22 MED ORDER — ONDANSETRON HCL 4 MG/2ML IJ SOLN
INTRAMUSCULAR | Status: DC | PRN
  Administered 2023-01-22: 4 mg via INTRAVENOUS

## 2023-01-22 MED ORDER — HYDROMORPHONE HCL 1 MG/ML IJ SOLN: 0.2500 mg | INTRAMUSCULAR | Status: DC | PRN

## 2023-01-22 MED ORDER — LIDOCAINE HCL (CARDIAC) PF 100 MG/5ML IV SOSY
PREFILLED_SYRINGE | INTRAVENOUS | Status: DC | PRN
  Administered 2023-01-22: 60 mg via INTRATRACHEAL

## 2023-01-22 MED ORDER — METHYLERGONOVINE MALEATE 0.2 MG/ML IJ SOLN
INTRAMUSCULAR | Status: AC
  Filled 2023-01-22: qty 1

## 2023-01-22 MED ORDER — SUGAMMADEX SODIUM 200 MG/2ML IV SOLN: INTRAVENOUS | Status: DC | PRN

## 2023-01-22 MED ORDER — ONDANSETRON HCL 4 MG/2ML IJ SOLN: 4.0000 mg | Freq: Once | INTRAMUSCULAR | Status: DC | PRN

## 2023-01-22 MED ORDER — KETOROLAC TROMETHAMINE 10 MG PO TABS: 10.0000 mg | ORAL_TABLET | Freq: Three times a day (TID) | ORAL | 0 refills | Status: DC | PRN

## 2023-01-22 MED ORDER — CHLORHEXIDINE GLUCONATE 0.12 % MT SOLN
15.0000 mL | Freq: Once | OROMUCOSAL | Status: AC
  Administered 2023-01-22: 15 mL via OROMUCOSAL
  Filled 2023-01-22: qty 15

## 2023-01-22 MED ORDER — 0.9 % SODIUM CHLORIDE (POUR BTL) OPTIME
TOPICAL | Status: DC | PRN
  Administered 2023-01-22: 1000 mL

## 2023-01-22 MED ORDER — MIDAZOLAM HCL 2 MG/2ML IJ SOLN: 2.0000 mg | Freq: Once | INTRAMUSCULAR | Status: DC

## 2023-01-22 MED ORDER — LACTATED RINGERS IV SOLN
INTRAVENOUS | Status: DC
  Administered 2023-01-22: 1000 mL via INTRAVENOUS

## 2023-01-22 SURGICAL SUPPLY — 21 items
BAG HAMPER (MISCELLANEOUS) ×2 IMPLANT
CLOTH BEACON ORANGE TIMEOUT ST (SAFETY) ×2 IMPLANT
COVER LIGHT HANDLE STERIS (MISCELLANEOUS) ×4 IMPLANT
GAUZE 4X4 16PLY ~~LOC~~+RFID DBL (SPONGE) ×4 IMPLANT
GLOVE BIOGEL PI IND STRL 7.0 (GLOVE) ×4 IMPLANT
GLOVE BIOGEL PI IND STRL 8 (GLOVE) ×2 IMPLANT
GLOVE ECLIPSE 8.0 STRL XLNG CF (GLOVE) ×4 IMPLANT
GOWN STRL REUS W/TWL LRG LVL3 (GOWN DISPOSABLE) ×2 IMPLANT
GOWN STRL REUS W/TWL XL LVL3 (GOWN DISPOSABLE) ×2 IMPLANT
KIT BERKELEY 1ST TRIMESTER 3/8 (MISCELLANEOUS) ×2 IMPLANT
KIT TURNOVER CYSTO (KITS) ×2 IMPLANT
MANIFOLD NEPTUNE II (INSTRUMENTS) ×2 IMPLANT
MARKER SKIN DUAL TIP RULER LAB (MISCELLANEOUS) ×2 IMPLANT
NS IRRIG 1000ML POUR BTL (IV SOLUTION) ×2 IMPLANT
PACK BASIC III (CUSTOM PROCEDURE TRAY) ×2
PACK SRG BSC III STRL LF ECLPS (CUSTOM PROCEDURE TRAY) ×2 IMPLANT
PAD ARMBOARD 7.5X6 YLW CONV (MISCELLANEOUS) ×2 IMPLANT
SET BASIN LINEN APH (SET/KITS/TRAYS/PACK) ×2 IMPLANT
SET BERKELEY SUCTION TUBING (SUCTIONS) ×2 IMPLANT
SHEET LAVH (DRAPES) ×2 IMPLANT
VACURETTE 10MM (CANNULA) IMPLANT

## 2023-01-22 NOTE — Op Note (Signed)
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  Both >25 mm which is diagnostic for non viable pregnancy.  Discussed options with Joellyn Haff CNM and she has decided to proceed with surgical management  Description of operation:   The patient was taken to the operating room and placed in the supine position.  She underwent laryngeal mask airway general anesthesia.  The patient was placed in the dorsal lithotomy position.  The vagina was prepped and draped in the usual sterile fashion.  A Graves speculum was placed.  The anterior cervix was grasped with a single-tooth tenaculum.  The cervix was dilated serially with Hegar dilators.  A #10 curved suction curette was placed in the uterus.  The suction pressure was placed at 55 and several passes were made.  All of the intrauterine contents were removed.  The sharp curette was used x1 to feel uterine crie in all areas.  The patient was given Methergine 0.2 mg IM x1.  There was good hemostasis.  The patient was given 2 grams Ancef IV preoperatively.  The patient was given Toradol 30 mg IV preoperatively.  Estimated blood loss for the procedure was 250 cc.  The patient was awakened from anesthesia taken to the recovery room in good stable condition.  All counts were correct x3.  The patient is O positive  Anora testing is being done  Lazaro Arms, MD 01/22/2023 10:18 AM

## 2023-01-22 NOTE — Anesthesia Procedure Notes (Signed)
Procedure Name: Intubation Date/Time: 01/22/2023 9:39 AM  Performed by: Lorin Glass, CRNAPre-anesthesia Checklist: Patient identified, Emergency Drugs available, Suction available and Patient being monitored Patient Re-evaluated:Patient Re-evaluated prior to induction Oxygen Delivery Method: Circle system utilized Preoxygenation: Pre-oxygenation with 100% oxygen Induction Type: IV induction Ventilation: Mask ventilation without difficulty Laryngoscope Size: Mac and 3 Grade View: Grade II Tube type: Oral Tube size: 7.0 mm Number of attempts: 1 Airway Equipment and Method: Stylet Placement Confirmation: ETT inserted through vocal cords under direct vision, positive ETCO2 and breath sounds checked- equal and bilateral Secured at: 21 cm Tube secured with: Tape Dental Injury: Teeth and Oropharynx as per pre-operative assessment

## 2023-01-22 NOTE — H&P (Signed)
Preoperative History and Physical  Katie Vargas is a 32 y.o. G3P1011 with Patient's last menstrual period was 10/25/2022. admitted for a surgical management of a DCDA anembryonic twin pregnancy in the first trimester.    PMH:    Past Medical History:  Diagnosis Date   Anxiety    Depression    Vaginal Pap smear, abnormal     PSH:     Past Surgical History:  Procedure Laterality Date   CESAREAN SECTION  2011   MOUTH SURGERY     teeth removed    POb/GynH:      OB History     Gravida  3   Para  1   Term  1   Preterm      AB  1   Living  1      SAB  1   IAB      Ectopic      Multiple      Live Births  1           SH:   Social History   Tobacco Use   Smoking status: Former    Types: E-cigarettes   Smokeless tobacco: Never  Building services engineer Use: Former  Substance Use Topics   Alcohol use: No   Drug use: No    FH:    Family History  Problem Relation Age of Onset   Other Maternal Grandfather        heart issues   Mental illness Father    Mental illness Mother      Allergies:  Allergies  Allergen Reactions   Penicillins     Side affects     Medications:       Current Facility-Administered Medications:    ceFAZolin (ANCEF) IVPB 2g/100 mL premix, 2 g, Intravenous, On Call to OR, Lazaro Arms, MD   lactated ringers infusion, , Intravenous, Continuous, Battula, Rajamani C, MD, Last Rate: 50 mL/hr at 01/22/23 0915, 1,000 mL at 01/22/23 0915   povidone-iodine 10 % swab 2 Application, 2 Application, Topical, Once, Lazaro Arms, MD   tranexamic acid (CYKLOKAPRON) 1000MG /135mL IVPB, , , ,   Review of Systems:   Review of Systems  Constitutional: Negative for fever, chills, weight loss, malaise/fatigue and diaphoresis.  HENT: Negative for hearing loss, ear pain, nosebleeds, congestion, sore throat, neck pain, tinnitus and ear discharge.   Eyes: Negative for blurred vision, double vision, photophobia, pain, discharge and redness.   Respiratory: Negative for cough, hemoptysis, sputum production, shortness of breath, wheezing and stridor.   Cardiovascular: Negative for chest pain, palpitations, orthopnea, claudication, leg swelling and PND.  Gastrointestinal: Positive for abdominal pain. Negative for heartburn, nausea, vomiting, diarrhea, constipation, blood in stool and melena.  Genitourinary: Negative for dysuria, urgency, frequency, hematuria and flank pain.  Musculoskeletal: Negative for myalgias, back pain, joint pain and falls.  Skin: Negative for itching and rash.  Neurological: Negative for dizziness, tingling, tremors, sensory change, speech change, focal weakness, seizures, loss of consciousness, weakness and headaches.  Endo/Heme/Allergies: Negative for environmental allergies and polydipsia. Does not bruise/bleed easily.  Psychiatric/Behavioral: Negative for depression, suicidal ideas, hallucinations, memory loss and substance abuse. The patient is not nervous/anxious and does not have insomnia.      PHYSICAL EXAM:  Temperature 98.2 F (36.8 C), temperature source Oral, resp. rate 18, last menstrual period 10/25/2022, SpO2 99 %.    Vitals reviewed. Constitutional: She is oriented to person, place, and time. She appears well-developed and well-nourished.  HENT:  Head: Normocephalic and atraumatic.  Right Ear: External ear normal.  Left Ear: External ear normal.  Nose: Nose normal.  Mouth/Throat: Oropharynx is clear and moist.  Eyes: Conjunctivae and EOM are normal. Pupils are equal, round, and reactive to light. Right eye exhibits no discharge. Left eye exhibits no discharge. No scleral icterus.  Neck: Normal range of motion. Neck supple. No tracheal deviation present. No thyromegaly present.  Cardiovascular: Normal rate, regular rhythm, normal heart sounds and intact distal pulses.  Exam reveals no gallop and no friction rub.   No murmur heard. Respiratory: Effort normal and breath sounds normal. No  respiratory distress. She has no wheezes. She has no rales. She exhibits no tenderness.  GI: Soft. Bowel sounds are normal. She exhibits no distension and no mass. There is tenderness. There is no rebound and no guarding.  Genitourinary:       Vulva is normal without lesions Vagina is pink moist without discharge Cervix normal in appearance and pap is normal Uterus is per sonogram Adnexa is negative with normal sized ovaries by sonogram  Musculoskeletal: Normal range of motion. She exhibits no edema and no tenderness.  Neurological: She is alert and oriented to person, place, and time. She has normal reflexes. She displays normal reflexes. No cranial nerve deficit. She exhibits normal muscle tone. Coordination normal.  Skin: Skin is warm and dry. No rash noted. No erythema. No pallor.  Psychiatric: She has a normal mood and affect. Her behavior is normal. Judgment and thought content normal.    Labs: Results for orders placed or performed during the hospital encounter of 01/21/23 (from the past 336 hour(s))  Urinalysis, Routine w reflex microscopic -Urine, Clean Catch   Collection Time: 01/21/23  1:26 PM  Result Value Ref Range   Color, Urine YELLOW YELLOW   APPearance HAZY (A) CLEAR   Specific Gravity, Urine 1.021 1.005 - 1.030   pH 5.0 5.0 - 8.0   Glucose, UA NEGATIVE NEGATIVE mg/dL   Hgb urine dipstick MODERATE (A) NEGATIVE   Bilirubin Urine NEGATIVE NEGATIVE   Ketones, ur NEGATIVE NEGATIVE mg/dL   Protein, ur 30 (A) NEGATIVE mg/dL   Nitrite NEGATIVE NEGATIVE   Leukocytes,Ua NEGATIVE NEGATIVE   RBC / HPF 21-50 0 - 5 RBC/hpf   WBC, UA 6-10 0 - 5 WBC/hpf   Bacteria, UA RARE (A) NONE SEEN   Squamous Epithelial / HPF 0-5 0 - 5 /HPF   Mucus PRESENT   CBC   Collection Time: 01/21/23  2:04 PM  Result Value Ref Range   WBC 8.4 4.0 - 10.5 K/uL   RBC 4.40 3.87 - 5.11 MIL/uL   Hemoglobin 11.3 (L) 12.0 - 15.0 g/dL   HCT 57.8 (L) 46.9 - 62.9 %   MCV 79.8 (L) 80.0 - 100.0 fL   MCH  25.7 (L) 26.0 - 34.0 pg   MCHC 32.2 30.0 - 36.0 g/dL   RDW 52.8 41.3 - 24.4 %   Platelets 321 150 - 400 K/uL   nRBC 0.0 0.0 - 0.2 %  Comprehensive metabolic panel   Collection Time: 01/21/23  2:04 PM  Result Value Ref Range   Sodium 134 (L) 135 - 145 mmol/L   Potassium 3.3 (L) 3.5 - 5.1 mmol/L   Chloride 104 98 - 111 mmol/L   CO2 21 (L) 22 - 32 mmol/L   Glucose, Bld 93 70 - 99 mg/dL   BUN 7 6 - 20 mg/dL   Creatinine, Ser 0.10 0.44 - 1.00 mg/dL  Calcium 8.4 (L) 8.9 - 10.3 mg/dL   Total Protein 7.4 6.5 - 8.1 g/dL   Albumin 3.2 (L) 3.5 - 5.0 g/dL   AST 21 15 - 41 U/L   ALT 18 0 - 44 U/L   Alkaline Phosphatase 52 38 - 126 U/L   Total Bilirubin 0.3 0.3 - 1.2 mg/dL   GFR, Estimated >16 >10 mL/min   Anion gap 9 5 - 15  RPR   Collection Time: 01/21/23  2:04 PM  Result Value Ref Range   RPR Ser Ql NON REACTIVE NON REACTIVE  Rapid HIV screen (HIV 1/2 Ab+Ag)   Collection Time: 01/21/23  2:04 PM  Result Value Ref Range   HIV-1 P24 Antigen - HIV24 NON REACTIVE NON REACTIVE   HIV 1/2 Antibodies NON REACTIVE NON REACTIVE   Interpretation (HIV Ag Ab)      A non reactive test result means that HIV 1 or HIV 2 antibodies and HIV 1 p24 antigen were not detected in the specimen.  Type and screen   Collection Time: 01/21/23  2:04 PM  Result Value Ref Range   ABO/RH(D) O POS    Antibody Screen NEG    Sample Expiration      01/24/2023,2359 Performed at [facility], [address], [place], [place] [address]     EKG: Orders placed or performed in visit on 01/05/20   EKG 12-Lead    Imaging Studies: US OB Comp Less 14 Wks  Result Date: 01/21/2023 Table formatting from the original result was not included.  ..an [employer] Technical sales engineer) accredited practice Center for [facility] [address] [place] [address] Ordering Provider: Adline Potter, NP                                                                                                                              DATING AND VIABILITY SONOGRAM Rollene Gelpi is a 32 y.o. year old G3P1011  Patient's last menstrual period was 10/25/2022. which would correlate to  [redacted]w[redacted]d weeks gestation.  She has regular menstrual cycles.   She is here today for a confirmatory initial sonogram. GESTATION: DC/DA TWIN GS  FETAL ACTIVITY:          Heart rate         No fetal pole or yolk sacs visualized       CERVIX: Appears closed ADNEXA: The ovaries are normal.limited view of left ovary GESTATIONAL AGE AND  BIOMETRICS: Gestational criteria: Estimated Date of Delivery: 08/01/23 by LMP now at [redacted]w[redacted]d Previous Scans:0 GESTATIONAL SAC A           43.6 mm         9+[redacted] weeks GESTATIONAL SAC B           33.9 mm        8+4  weeks  AVERAGE EGA(BY THIS SCAN): A: 9+6 weeks                                                                                                        B:  8+4 weeks WORKING EDD: to be determined  TECHNICIAN COMMENTS: [place] TWIN GS DC/DA,normal ovaries,limited view of left ovary GS A: 43.6 mm=9+6 wks,no fetal pole or yolk sac visualized GS B:  33.9 mm=8+4 wks,no fetal pole or yolk sac visualized A copy of this report including all images has been saved and backed up to a second source for retrieval if needed. All measures and details of the anatomical scan, placentation, fluid volume and pelvic anatomy are contained in that report. [name] 01/20/2023 2:56 PM Clinical Impression and recommendations: I have reviewed the sonogram results above, combined with the patient's current clinical course, below are my impressions and any appropriate recommendations for management based on the sonographic findings. Anembryonic twin pregnancy with both gestational sacs > 25 mm, which is diagnostic with a single scan G3P1011 Estimated Date of Delivery: NA Normal general sonographic findings Discussed management options with Joellyn Haff CNM, pt to decide  course Lazaro Arms 01/21/2023 9:19 AM  US OB Transvaginal  Result Date: 01/21/2023 Table formatting from the original result was not included.  ..an [employer] Technical sales engineer) accredited practice Center for [facility] [address] [place] [address] Ordering Provider: Adline Potter, NP                                                                                                                             DATING AND VIABILITY SONOGRAM Zaydah Sansing is a 32 y.o. year old G3P1011  Patient's last menstrual period was 10/25/2022. which would correlate to  [redacted]w[redacted]d weeks gestation.  She has regular menstrual cycles.   She is here today for a confirmatory initial sonogram. GESTATION: DC/DA TWIN GS  FETAL ACTIVITY:          Heart rate         No fetal pole or yolk sacs visualized       CERVIX: Appears closed ADNEXA: The ovaries are normal.limited view of left ovary GESTATIONAL AGE AND  BIOMETRICS: Gestational criteria: Estimated Date of Delivery: 08/01/23 by LMP now at [redacted]w[redacted]d Previous Scans:0 GESTATIONAL SAC A           43.6 mm         9+[redacted] weeks GESTATIONAL SAC B  33.9 mm        8+4  weeks                                                                      AVERAGE EGA(BY THIS SCAN): A: 9+6 weeks                                                                                                        B:  8+4 weeks WORKING EDD: to be determined  TECHNICIAN COMMENTS: [place] TWIN GS DC/DA,normal ovaries,limited view of left ovary GS A: 43.6 mm=9+6 wks,no fetal pole or yolk sac visualized GS B:  33.9 mm=8+4 wks,no fetal pole or yolk sac visualized A copy of this report including all images has been saved and backed up to a second source for retrieval if needed. All measures and details of the anatomical scan, placentation, fluid volume and pelvic anatomy are contained in that report. [name] 01/20/2023 2:56 PM Clinical Impression and recommendations: I have  reviewed the sonogram results above, combined with the patient's current clinical course, below are my impressions and any appropriate recommendations for management based on the sonographic findings. Anembryonic twin pregnancy with both gestational sacs > 25 mm, which is diagnostic with a single scan G3P1011 Estimated Date of Delivery: NA Normal general sonographic findings Discussed management options with Joellyn Haff CNM, pt to decide course Lazaro Arms 01/21/2023 9:19 AM     Assessment: Anembryonic twin pregnancy in the first trimester  Plan: Cervical dilation with Suction and sharp uterine curettage  Lazaro Arms 01/22/2023 9:20 AM

## 2023-01-22 NOTE — Anesthesia Postprocedure Evaluation (Signed)
Anesthesia Post Note  Patient: Yazlynn Duplechain  Procedure(s) Performed: SUCTION DILATATION AND CURETTAGE (Vagina ) CHROMOSOME STUDIES (Vagina )  Patient location during evaluation: Phase II Anesthesia Type: General Level of consciousness: awake and alert and oriented Pain management: pain level controlled Vital Signs Assessment: post-procedure vital signs reviewed and stable Respiratory status: spontaneous breathing, nonlabored ventilation and respiratory function stable Cardiovascular status: blood pressure returned to baseline and stable Postop Assessment: no apparent nausea or vomiting Anesthetic complications: no  No notable events documented.   Last Vitals:  Vitals:   01/22/23 1045 01/22/23 1059  BP: 113/75 116/74  Pulse: 76 73  Resp: 16 16  Temp:  36.7 C  SpO2: 97% 99%    Last Pain:  Vitals:   01/22/23 1059  TempSrc: Oral  PainSc: 3                  Emalee Knies C Brentley Landfair

## 2023-01-22 NOTE — Transfer of Care (Signed)
Immediate Anesthesia Transfer of Care Note  Patient: Katie Vargas  Procedure(s) Performed: SUCTION DILATATION AND CURETTAGE (Vagina ) CHROMOSOME STUDIES (Vagina )  Patient Location: PACU  Anesthesia Type:General  Level of Consciousness: awake and alert   Airway & Oxygen Therapy: Patient Spontanous Breathing and Patient connected to nasal cannula oxygen  Post-op Assessment: Report given to RN and Post -op Vital signs reviewed and stable  Post vital signs: Reviewed and stable  Last Vitals:  Vitals Value Taken Time  BP 152/111   Temp 36.8 C 01/22/23 1017  Pulse 96 01/22/23 1017  Resp 12 01/22/23 1017  SpO2 100 % 01/22/23 1017  Vitals shown include unvalidated device data.  Last Pain:  Vitals:   01/22/23 0915  TempSrc: Oral  PainSc: 0-No pain      Patients Stated Pain Goal: 7 (01/22/23 0915)  Complications: No notable events documented.

## 2023-01-22 NOTE — Anesthesia Preprocedure Evaluation (Signed)
Anesthesia Evaluation  Patient identified by MRN, date of birth, ID band Patient awake    Reviewed: Allergy & Precautions, H&P , NPO status , Patient's Chart, lab work & pertinent test results  Airway Mallampati: II  TM Distance: >3 FB Neck ROM: Full    Dental no notable dental hx. (+) Dental Advisory Given   Pulmonary neg pulmonary ROS, former smoker   Pulmonary exam normal breath sounds clear to auscultation       Cardiovascular negative cardio ROS Normal cardiovascular exam Rhythm:Regular Rate:Normal     Neuro/Psych  PSYCHIATRIC DISORDERS Anxiety Depression    negative neurological ROS     GI/Hepatic negative GI ROS, Neg liver ROS,,,  Endo/Other    Morbid obesity  Renal/GU negative Renal ROS  negative genitourinary   Musculoskeletal negative musculoskeletal ROS (+)    Abdominal   Peds negative pediatric ROS (+)  Hematology negative hematology ROS (+)   Anesthesia Other Findings Recent upper respiratory infection, resolved  Reproductive/Obstetrics negative OB ROS                             Anesthesia Physical Anesthesia Plan  ASA: 3  Anesthesia Plan: General   Post-op Pain Management: Dilaudid IV   Induction: Intravenous  PONV Risk Score and Plan: 4 or greater and Ondansetron, Dexamethasone, Metaclopromide and Midazolam  Airway Management Planned: Oral ETT  Additional Equipment:   Intra-op Plan:   Post-operative Plan: Extubation in OR  Informed Consent: I have reviewed the patients History and Physical, chart, labs and discussed the procedure including the risks, benefits and alternatives for the proposed anesthesia with the patient or authorized representative who has indicated his/her understanding and acceptance.     Dental advisory given  Plan Discussed with: CRNA and Surgeon  Anesthesia Plan Comments:        Anesthesia Quick Evaluation

## 2023-01-23 LAB — SURGICAL PATHOLOGY

## 2023-01-28 ENCOUNTER — Encounter: Payer: Self-pay | Admitting: Obstetrics & Gynecology

## 2023-01-28 ENCOUNTER — Ambulatory Visit (INDEPENDENT_AMBULATORY_CARE_PROVIDER_SITE_OTHER): Payer: BC Managed Care – PPO | Admitting: Obstetrics & Gynecology

## 2023-01-28 VITALS — BP 128/78 | HR 84 | Ht 61.0 in | Wt 236.0 lb

## 2023-01-28 DIAGNOSIS — O020 Blighted ovum and nonhydatidiform mole: Secondary | ICD-10-CM

## 2023-01-28 DIAGNOSIS — Z9889 Other specified postprocedural states: Secondary | ICD-10-CM

## 2023-01-28 DIAGNOSIS — Z3A12 12 weeks gestation of pregnancy: Secondary | ICD-10-CM

## 2023-01-28 MED ORDER — KETOROLAC TROMETHAMINE 10 MG PO TABS: 10.0000 mg | ORAL_TABLET | Freq: Three times a day (TID) | ORAL | 0 refills | Status: DC | PRN

## 2023-01-28 NOTE — Progress Notes (Signed)
  [id number]  Patient returns for routine postoperative follow-up having undergone D&C for anembryonic twin pregnancy loss on 01/22/23.  The patient's immediate postoperative recovery has been unremarkable. Since hospital discharge the patient reports no problems.   Current Outpatient Medications: escitalopram (LEXAPRO) 10 MG tablet, Take 1 tablet (10 mg total) by mouth daily. (Patient not taking: Reported on 01/28/2023), Disp: 30 tablet, Rfl: 2 HYDROcodone-acetaminophen (NORCO/VICODIN) 5-325 MG tablet, Take 1 tablet by mouth every 6 (six) hours as needed. (Patient not taking: Reported on 01/28/2023), Disp: 10 tablet, Rfl: 0 ketorolac (TORADOL) 10 MG tablet, Take 1 tablet (10 mg total) by mouth every 8 (eight) hours as needed., Disp: 15 tablet, Rfl: 0 misoprostol (CYTOTEC) 200 MCG tablet, Take 2 tablets (400 mcg total) by mouth 3 (three) times daily for 3 days., Disp: 18 tablet, Rfl: 0 Prenatal Vit-Fe Fumarate-FA (PRENATAL VITAMIN PO), Take by mouth. (Patient not taking: Reported on 01/28/2023), Disp: , Rfl:  promethazine (PHENERGAN) 12.5 MG tablet, Take 1 tablet (12.5 mg total) by mouth every 6 (six) hours as needed. (Patient not taking: Reported on 01/28/2023), Disp: 30 tablet, Rfl: 1  No current facility-administered medications for this visit.    Blood pressure 128/78, pulse 84, height 5\' 1"  (1.549 m), weight 236 lb (107 kg), last menstrual period 10/25/2022, unknown if currently breastfeeding.  Physical Exam: GEN WDWN NAD  Diagnostic Tests: Kizzie Fantasia is pending  Pathology: benign  Impression + Management plan:   ICD-10-CM   1. Blighted ovum of DCDA twin pregnancy  O02.0     2. Post-operative state: D&C  Z98.890    doing well, cramping continues, Rx Toradol         Medications Prescribed this encounter: Orders Placed This Encounter     ketorolac (TORADOL) 10 MG tablet         Sig: Take 1 tablet (10 mg total) by mouth every 8 (eight) hours as needed.         Dispense:  15  tablet         Refill:  0     Follow up: Return in about 3 months (around 04/30/2023) for Post Op. Recommend APA testing given 2 recent losses Pt to consider and draw labs at 12 weeks pp   Lazaro Arms, MD Attending Physician for the Center for [facility] 01/28/2023 8:51 AM

## 2023-01-29 NOTE — Addendum Note (Signed)
Addendum  created 01/29/23 1413 by Lorin Glass, CRNA   Intraprocedure Staff edited

## 2023-01-31 ENCOUNTER — Encounter (HOSPITAL_COMMUNITY): Payer: Self-pay | Admitting: Obstetrics & Gynecology

## 2023-02-06 LAB — ANORA MISCARRIAGE TEST - FRESH

## 2023-04-06 ENCOUNTER — Other Ambulatory Visit: Payer: Self-pay | Admitting: Adult Health

## 2023-06-07 ENCOUNTER — Other Ambulatory Visit: Payer: Self-pay | Admitting: Adult Health

## 2023-06-09 ENCOUNTER — Other Ambulatory Visit: Payer: BC Managed Care – PPO

## 2023-06-27 ENCOUNTER — Other Ambulatory Visit: Payer: Self-pay | Admitting: Adult Health

## 2023-06-27 MED ORDER — HYDROCHLOROTHIAZIDE 12.5 MG PO CAPS: 12.5000 mg | ORAL_CAPSULE | Freq: Every day | ORAL | 6 refills | Status: AC

## 2023-06-27 NOTE — Progress Notes (Signed)
Refilled microzide 

## 2023-08-28 MED ORDER — MEGESTROL ACETATE 40 MG PO TABS: ORAL_TABLET | ORAL | 0 refills | Status: DC

## 2023-08-28 MED ORDER — DESOGESTREL-ETHINYL ESTRADIOL 0.15-30 MG-MCG PO TABS: 1.0000 | ORAL_TABLET | Freq: Every day | ORAL | 11 refills | Status: DC

## 2023-10-07 ENCOUNTER — Other Ambulatory Visit (HOSPITAL_COMMUNITY)
Admission: RE | Admit: 2023-10-07 | Discharge: 2023-10-07 | Disposition: A | Discharge status: Home / Self Care | Payer: Medicaid Other | Source: Ambulatory Visit | Attending: Women's Health | Admitting: Women's Health

## 2023-10-07 ENCOUNTER — Ambulatory Visit: Payer: Medicaid Other | Admitting: Women's Health

## 2023-10-07 ENCOUNTER — Encounter: Payer: Self-pay | Admitting: Women's Health

## 2023-10-07 VITALS — BP 118/81 | HR 84 | Ht 61.5 in | Wt 240.6 lb

## 2023-10-07 DIAGNOSIS — Z3202 Encounter for pregnancy test, result negative: Secondary | ICD-10-CM | POA: Diagnosis not present

## 2023-10-07 DIAGNOSIS — N926 Irregular menstruation, unspecified: Secondary | ICD-10-CM | POA: Diagnosis not present

## 2023-10-07 DIAGNOSIS — Z30011 Encounter for initial prescription of contraceptive pills: Secondary | ICD-10-CM | POA: Diagnosis not present

## 2023-10-07 DIAGNOSIS — F418 Other specified anxiety disorders: Secondary | ICD-10-CM | POA: Diagnosis not present

## 2023-10-07 LAB — POCT URINE PREGNANCY: Preg Test, Ur: NEGATIVE

## 2023-10-07 MED ORDER — LO LOESTRIN FE 1 MG-10 MCG / 10 MCG PO TABS: 1.0000 | ORAL_TABLET | Freq: Every day | ORAL | 3 refills | Status: DC

## 2023-10-07 MED ORDER — ESCITALOPRAM OXALATE 20 MG PO TABS: 20.0000 mg | ORAL_TABLET | Freq: Every day | ORAL | 3 refills | Status: AC

## 2023-10-07 NOTE — Progress Notes (Signed)
 GYN VISIT Patient name: Katie Vargas MRN [id number]  Date of birth: [birth year] Chief Complaint:   Contraception  History of Present Illness:   Katie Vargas is a 33 y.o. [phone] Caucasian female being seen today to discuss birth control. Reports 3 SABs, latest one had +HPT but neg in MD office, but bled like miscarriage. Does not want APA testing like previously discussed. Does not want another pregnancy right now. Wants pills. Does not smoke (does vape), no h/o HTN, DVT/PE, CVA, MI, or migraines w/ aura.  Periods monthly, but are lasting 2-3wks. Denies abnormal discharge, itching/odor/irritation.   Also thinks lexapro  needs to be increased, just doesn't feel it's working like it used to. Denies SI/HI. Is ok w/ [facility]BH referral.  Patient's last menstrual period was 09/16/2023 (exact date). Last pap 03/28/21. Results were: NILM w/ HRHPV negative     10/07/2023    2:42 PM 12/26/2022    3:32 PM 03/28/2021    4:08 PM 11/24/2020    1:15 PM 12/23/2019    4:20 PM  Depression screen PHQ 2/9  Decreased Interest 3 1 1  0 2  Down, Depressed, Hopeless 2 1 0 1 1  PHQ - 2 Score 5 2 1 1 3   Altered sleeping 0 3 2 1 2   Tired, decreased energy 3 3 3 3 3   Change in appetite 1 0 0 2 3  Feeling bad or failure about yourself  1 2 0 0 1  Trouble concentrating 3 3 1 3 2   Moving slowly or fidgety/restless 0 1 0 3 1  Suicidal thoughts 0 1 0 0 0  PHQ-9 Score 13 15 7 13 15   Difficult doing work/chores  Very difficult   Somewhat difficult        10/07/2023    2:44 PM 12/26/2022    3:34 PM 03/28/2021    4:08 PM 12/23/2019    4:20 PM  GAD 7 : Generalized Anxiety Score  Nervous, Anxious, on Edge 2 0 1 2  Control/stop worrying 1 1 0 2  Worry too much - different things 1 2 1 3   Trouble relaxing 1 1 1 1   Restless 2 0 1 0  Easily annoyed or irritable 3 3 3 3   Afraid - awful might happen 1 3 0 0  Total GAD 7 Score 11 10 7 11   Anxiety Difficulty    Very difficult     Review of Systems:   Pertinent items are noted in  HPI Denies fever/chills, dizziness, headaches, visual disturbances, fatigue, shortness of breath, chest pain, abdominal pain, vomiting, abnormal vaginal discharge/itching/odor/irritation, problems with periods, bowel movements, urination, or intercourse unless otherwise stated above.  Pertinent History Reviewed:  Reviewed past medical,surgical, social, obstetrical and family history.  Reviewed problem list, medications and allergies. Physical Assessment:   Vitals:   10/07/23 1411  BP: 118/81  Pulse: 84  Weight: 240 lb 9.6 oz (109.1 kg)  Height: 5' 1.5 (1.562 m)  Body mass index is 44.72 kg/m.       Physical Examination:   General appearance: alert, well appearing, and in no distress  Mental status: alert, oriented to person, place, and time  Skin: warm & dry   Cardiovascular: normal heart rate noted  Respiratory: normal respiratory effort, no distress  Abdomen: soft, non-tender   Pelvic: examination not indicated, self-collected CV swab  Extremities: no edema   Chaperone: N/A    Results for orders placed or performed in visit on 10/07/23 (from the past 24 hours)  POCT urine  pregnancy   Collection Time: 10/07/23  2:17 PM  Result Value Ref Range   Preg Test, Ur Negative Negative    Assessment & Plan:  1) Contraception management> rx LoLo, f/u  2) Prolonged periods> CV swab, rx COCs  3) H/O SAB x 3> declines APA testing  4) Dep/anx> increase lexapro  to 20mg , IBH referral  Meds:  Meds ordered this encounter  Medications   escitalopram  (LEXAPRO ) 20 MG tablet    Sig: Take 1 tablet (20 mg total) by mouth daily.    Dispense:  90 tablet    Refill:  3   LO LOESTRIN FE  1 MG-10 MCG / 10 MCG tablet    Sig: Take 1 tablet by mouth daily.    Dispense:  90 tablet    Refill:  3    For co-pay card, pt to text Lo Loestrin Fe   to [phone]              Co-pay card must be run in second position  other coverage code 3  if denied d/t PA, step edit, or insurance denial     Orders Placed This Encounter  Procedures   Amb ref to [facility]   POCT urine pregnancy    Return in about 4 weeks (around 11/04/2023) for med f/u, in person or online; from now med f/u in person.  Suzen JONELLE Fetters CNM, [facility] 10/07/2023 2:45 PM

## 2023-10-09 ENCOUNTER — Encounter: Payer: Self-pay | Admitting: Women's Health

## 2023-10-09 LAB — CERVICOVAGINAL ANCILLARY ONLY
Bacterial Vaginitis (gardnerella): NEGATIVE
Candida Glabrata: NEGATIVE
Candida Vaginitis: POSITIVE — AB
Chlamydia: NEGATIVE
Comment: NEGATIVE
Comment: NEGATIVE
Comment: NEGATIVE
Comment: NEGATIVE
Comment: NEGATIVE
Comment: NORMAL
Neisseria Gonorrhea: NEGATIVE
Trichomonas: NEGATIVE

## 2023-10-09 MED ORDER — FLUCONAZOLE 150 MG PO TABS: 150.0000 mg | ORAL_TABLET | Freq: Once | ORAL | 0 refills | Status: AC

## 2023-10-09 NOTE — Addendum Note (Signed)
 Addended by: Ferd Householder on: 10/09/2023 01:32 PM   Modules accepted: Orders

## 2023-10-29 NOTE — BH Specialist Note (Unsigned)
 Integrated Behavioral Health via Telemedicine Visit  11/13/2023 Katie Vargas [id number]  Number of Integrated Behavioral Health Clinician visits: 1- Initial Visit  Session Start time: 1543   Session End time: 1620  Total time in minutes: 37   Referring Provider: Shawna Clamp, CNM Patient/Family location: Work/[facility] Provider location: [facility]  All persons participating in visit: Patient Katie Vargas and [facility] Sunil Hue   Types of Service: Individual psychotherapy and Video visit  I connected with Nautica Cremer and/or Eugenia Marcil's  n/a  via  Telephone or Video Enabled Telemedicine Application  (Video is Caregility application) and verified that I am speaking with the correct person using two identifiers. Discussed confidentiality: Yes   I discussed the limitations of telemedicine and the availability of in person appointments.  Discussed there is a possibility of technology failure and discussed alternative modes of communication if that failure occurs.  I discussed that engaging in this telemedicine visit, they consent to the provision of behavioral healthcare and the services will be billed under their insurance.  Patient and/or legal guardian expressed understanding and consented to Telemedicine visit: Yes   Presenting Concerns: Patient and/or family reports the following symptoms/concerns: Low mood and increased irritability; attributes partially to experiencing three pregnancy losses in the past year; currently coping by taking Lexapro, taking a drive, listening to music and time outside. Pt interested in "getting into shape" is motivated to begin working out, playing guitar, walking, starting a sport, singing and beginning a side job.  Duration of problem: Increasing over the past year; Severity of problem: moderate  Patient and/or Family's Strengths/Protective Factors: Social connections, Social and Emotional  competence, Concrete supports in place (healthy food, safe environments, etc.), and Sense of purpose  Goals Addressed: Patient will:  Reduce symptoms of: anxiety and depression   Increase knowledge and/or ability of: self-management skills   Demonstrate ability to: Increase healthy adjustment to current life circumstances, Increase adequate support systems for patient/family, Increase motivation to adhere to plan of care, and Continue healthy grieving over losses  Progress towards Goals: Ongoing  Interventions: Interventions utilized:  Behavioral Activation and Supportive Reflection Standardized Assessments completed: Not Needed  Patient and/or Family Response: Patient agrees with treatment plan.   Assessment: Patient currently experiencing Adjustment disorder with mixed anxious and depressed mood; Grief.   Patient may benefit from psychoeducation and brief therapeutic interventions regarding coping with symptoms of depression, anxiety .  Plan: Follow up with behavioral health clinician on : Two weeks Behavioral recommendations:  -Continue using coping strategies that have been helpful in the past (drive, music, time outdoors, participate in choir) -Begin Worry Time strategy(modified for "tasks"), as discussed. Start by setting up start and end time reminders on phone today; continue daily for two weeks. -Consider adding chosen activities to worry/task time list (working out, Tax adviser, [employer]; Customer service manager) -Go to [employer] this week to see location and check out the amenities available Referral(s): Integrated Art gallery manager (In Clinic) and [facility]:  Pregnancy loss support  I discussed the assessment and treatment plan with the patient and/or parent/guardian. They were provided an opportunity to ask questions and all were answered. They agreed with the plan and demonstrated an understanding of the instructions.   They were advised to  call back or seek an in-person evaluation if the symptoms worsen or if the condition fails to improve as anticipated.  Rae Lips, LCSW     11/06/2023    3:58  PM 10/07/2023    2:42 PM 12/26/2022    3:32 PM 03/28/2021    4:08 PM 11/24/2020    1:15 PM  Depression screen PHQ 2/9  Decreased Interest 1 3 1 1  0  Down, Depressed, Hopeless 0 2 1 0 1  PHQ - 2 Score 1 5 2 1 1   Altered sleeping 0 0 3 2 1   Tired, decreased energy 1 3 3 3 3   Change in appetite 0 1 0 0 2  Feeling bad or failure about yourself  1 1 2  0 0  Trouble concentrating 1 3 3 1 3   Moving slowly or fidgety/restless 1 0 1 0 3  Suicidal thoughts 0 0 1 0 0  PHQ-9 Score 5 13 15 7 13   Difficult doing work/chores   Very difficult        11/06/2023    4:03 PM 10/07/2023    2:44 PM 12/26/2022    3:34 PM 03/28/2021    4:08 PM  GAD 7 : Generalized Anxiety Score  Nervous, Anxious, on Edge 0 2 0 1  Control/stop worrying 0 1 1 0  Worry too much - different things 0 1 2 1   Trouble relaxing 1 1 1 1   Restless 1 2 0 1  Easily annoyed or irritable 3 3 3 3   Afraid - awful might happen 0 1 3 0  Total GAD 7 Score 5 11 10  7

## 2023-11-04 ENCOUNTER — Telehealth: Payer: Medicaid Other | Admitting: Women's Health

## 2023-11-06 ENCOUNTER — Telehealth (INDEPENDENT_AMBULATORY_CARE_PROVIDER_SITE_OTHER): Payer: Medicaid Other | Admitting: Women's Health

## 2023-11-06 ENCOUNTER — Encounter: Payer: Self-pay | Admitting: Women's Health

## 2023-11-06 DIAGNOSIS — F418 Other specified anxiety disorders: Secondary | ICD-10-CM | POA: Diagnosis not present

## 2023-11-06 NOTE — Progress Notes (Signed)
 TELEHEALTH VIRTUAL GYN VISIT ENCOUNTER NOTE Patient name: Katie Vargas MRN [id number]  Date of birth: [birth year]  I connected with patient on 11/06/23 at  4:10 PM EST by MyChart video  and verified that I am speaking with the correct person using two identifiers.  Pt is not currently in the office, she is in her car.  Provider is in the office.    I discussed the limitations, risks, security and privacy concerns of performing an evaluation and management service by telephone and the availability of in person appointments. I also discussed with the patient that there may be a patient responsible charge related to this service. The patient expressed understanding and agreed to proceed.   Chief Complaint:   Follow-up (LoLo still bleeding/Lexapro working)  History of Present Illness:   Katie Vargas is a 33 y.o. [phone] Caucasian female being evaluated today for f/u on lexapro, increased from 10mg  to 20mg  on 2/4 for dep/anx. Has appt w/ [facility] 3/12. Rx'd LoLo on 2/4, still bleeding.     11/06/2023    3:58 PM 10/07/2023    2:42 PM 12/26/2022    3:32 PM 03/28/2021    4:08 PM 11/24/2020    1:15 PM  Depression screen PHQ 2/9  Decreased Interest 1 3 1 1  0  Down, Depressed, Hopeless 0 2 1 0 1  PHQ - 2 Score 1 5 2 1 1   Altered sleeping 0 0 3 2 1   Tired, decreased energy 1 3 3 3 3   Change in appetite 0 1 0 0 2  Feeling bad or failure about yourself  1 1 2  0 0  Trouble concentrating 1 3 3 1 3   Moving slowly or fidgety/restless 1 0 1 0 3  Suicidal thoughts 0 0 1 0 0  PHQ-9 Score 5 13 15 7 13   Difficult doing work/chores   Very difficult      Patient's last menstrual period was 10/09/2023 (exact date). The current method of family planning is OCP (estrogen/progesterone).  Last pap 03/28/21. Results were:  NILM w/ HRHPV negative Review of Systems:   Pertinent items are noted in HPI Denies fever/chills, dizziness, headaches, visual disturbances, fatigue, shortness of breath, chest pain, abdominal pain,  vomiting, abnormal vaginal discharge/itching/odor/irritation, problems with periods, bowel movements, urination, or intercourse unless otherwise stated above.  Pertinent History Reviewed:  Reviewed past medical,surgical, social, obstetrical and family history.  Reviewed problem list, medications and allergies. Physical Assessment:  There were no vitals filed for this visit.There is no height or weight on file to calculate BMI.       Physical Examination:   General:  Alert, oriented and cooperative.   Mental Status: Normal mood and affect perceived. Normal judgment and thought content.  Physical exam deferred due to nature of the encounter  No results found for this or any previous visit (from the past 24 hours).  Assessment & Plan:  1) Dep/anx> doing much better on lexapro 20mg , has appt w/ Jamie/[facility] next week   2) Prolonged bleeding> slowing down some on LoLo (just started 2/4), to give some more time, f/u in May, let me know if needs anything prior  Meds: No orders of the defined types were placed in this encounter.   No orders of the defined types were placed in this encounter.   I discussed the assessment and treatment plan with the patient. The patient was provided an opportunity to ask questions and all were answered. The patient agreed with the plan and demonstrated an understanding  of the instructions.   The patient was advised to call back or seek an in-person evaluation/go to the ED if the symptoms worsen or if the condition fails to improve as anticipated.  I provided 10 minutes of non-face-to-face time during this encounter.   Return for 5/12 in person for med f/u w/ CNM. (Note routed to [place] to schedule)  Cheral Marker CNM, [facility] 11/06/2023 4:07 PM

## 2023-11-12 ENCOUNTER — Ambulatory Visit (INDEPENDENT_AMBULATORY_CARE_PROVIDER_SITE_OTHER): Payer: Medicaid Other | Admitting: Clinical

## 2023-11-12 DIAGNOSIS — F4323 Adjustment disorder with mixed anxiety and depressed mood: Secondary | ICD-10-CM

## 2023-11-13 NOTE — Patient Instructions (Signed)
 Center for [facility] at [facility] [address] [place], [place] [address] [phone] (main office) [phone] (Konner Saiz's office)

## 2023-11-14 NOTE — BH Specialist Note (Signed)
 Pt DID arrive to video visit, but left prior to being seen; pt  did not answer the phone; Left HIPPA-compliant message to call back Asher Muir from [employer] for [employer] at [facility] at  [phone] [facility] office).  ; left MyChart message for patient.

## 2023-11-27 ENCOUNTER — Ambulatory Visit: Admitting: Clinical

## 2023-11-27 DIAGNOSIS — Z91199 Patient's noncompliance with other medical treatment and regimen due to unspecified reason: Secondary | ICD-10-CM

## 2023-12-28 ENCOUNTER — Encounter: Payer: Self-pay | Admitting: Women's Health

## 2023-12-30 ENCOUNTER — Ambulatory Visit: Admitting: Obstetrics & Gynecology

## 2024-01-14 ENCOUNTER — Ambulatory Visit: Admitting: Women's Health

## 2024-04-01 ENCOUNTER — Encounter: Payer: Self-pay | Admitting: Women's Health

## 2024-05-06 ENCOUNTER — Ambulatory Visit: Admitting: Women's Health

## 2024-05-17 ENCOUNTER — Ambulatory Visit: Admitting: Women's Health

## 2024-06-29 ENCOUNTER — Ambulatory Visit (INDEPENDENT_AMBULATORY_CARE_PROVIDER_SITE_OTHER): Admitting: Obstetrics and Gynecology

## 2024-06-29 ENCOUNTER — Encounter: Payer: Self-pay | Admitting: Obstetrics and Gynecology

## 2024-06-29 ENCOUNTER — Other Ambulatory Visit (HOSPITAL_COMMUNITY)
Admission: RE | Admit: 2024-06-29 | Discharge: 2024-06-29 | Disposition: A | Source: Ambulatory Visit | Attending: Obstetrics and Gynecology | Admitting: Obstetrics and Gynecology

## 2024-06-29 VITALS — BP 113/81 | HR 106 | Ht 61.5 in | Wt 250.8 lb

## 2024-06-29 DIAGNOSIS — R600 Localized edema: Secondary | ICD-10-CM

## 2024-06-29 DIAGNOSIS — F419 Anxiety disorder, unspecified: Secondary | ICD-10-CM

## 2024-06-29 DIAGNOSIS — Z01419 Encounter for gynecological examination (general) (routine) without abnormal findings: Secondary | ICD-10-CM

## 2024-06-29 DIAGNOSIS — Z30430 Encounter for insertion of intrauterine contraceptive device: Secondary | ICD-10-CM | POA: Diagnosis not present

## 2024-06-29 DIAGNOSIS — F32A Depression, unspecified: Secondary | ICD-10-CM | POA: Diagnosis not present

## 2024-06-29 DIAGNOSIS — Z113 Encounter for screening for infections with a predominantly sexual mode of transmission: Secondary | ICD-10-CM | POA: Insufficient documentation

## 2024-06-29 DIAGNOSIS — Z124 Encounter for screening for malignant neoplasm of cervix: Secondary | ICD-10-CM | POA: Diagnosis present

## 2024-06-29 LAB — POCT URINE PREGNANCY: Preg Test, Ur: NEGATIVE

## 2024-06-29 MED ORDER — FUROSEMIDE 20 MG PO TABS
20.0000 mg | ORAL_TABLET | Freq: Two times a day (BID) | ORAL | 0 refills | Status: AC
Start: 2024-06-29 — End: 2024-07-04

## 2024-06-29 MED ORDER — LEVONORGESTREL 20 MCG/DAY IU IUD
1.0000 | INTRAUTERINE_SYSTEM | Freq: Once | INTRAUTERINE | Status: AC
Start: 2024-06-29 — End: 2024-06-29
  Administered 2024-06-29: 1 via INTRAUTERINE

## 2024-06-29 MED ORDER — BUPROPION HCL ER (XL) 150 MG PO TB24
150.0000 mg | ORAL_TABLET | Freq: Every day | ORAL | 3 refills | Status: DC
Start: 2024-06-29 — End: 2024-07-26

## 2024-06-29 NOTE — Progress Notes (Signed)
 ANNUAL EXAM Patient name: Katie Vargas MRN [id number]  Date of birth: Oct 29, 1990 Chief Complaint:   No chief complaint on file.  History of Present Illness:   Katie Vargas is a 33 y.o. [phone]  female being seen today for a routine annual exam.  Current complaints: has been on OCP for months and continues to have irregular bleeding and spotting, will have cycle in between expected period or bleeding lasting over two weeks. She had an IUD before and is considering this again.  She was on hydrochlorothiazide  for edema previously and is still experiencing swelling in lower legs.  Reports mood not hugely improved with lexapro , does reports irritability   Patient's last menstrual period was 03/22/2024.   Upstream - 06/29/24 1535       Pregnancy Intention Screening   Does the patient want to become pregnant in the next year? No    Does the patient's partner want to become pregnant in the next year? No    Would the patient like to discuss contraceptive options today? Yes         The pregnancy intention screening data noted above was reviewed. Potential methods of contraception were discussed. The patient elected to proceed with No data recorded.   Gynecologic History Patient's last menstrual period was . Contraception:  Last Pap:03/2021 . Results were: NILM HPV - Last mammogram:n/a     06/29/2024    3:35 PM 11/06/2023    3:58 PM 10/07/2023    2:42 PM 12/26/2022    3:32 PM 03/28/2021    4:08 PM  Depression screen PHQ 2/9  Decreased Interest 2 1 3 1 1   Down, Depressed, Hopeless 2 0 2 1 0  PHQ - 2 Score 4 1 5 2 1   Altered sleeping 3 0 0 3 2  Tired, decreased energy 3 1 3 3 3   Change in appetite 1 0 1 0 0  Feeling bad or failure about yourself  0 1 1 2  0  Trouble concentrating 2 1 3 3 1   Moving slowly or fidgety/restless 3 1 0 1 0  Suicidal thoughts 0 0 0 1 0  PHQ-9 Score 16 5 13 15 7   Difficult doing work/chores    Very difficult         06/29/2024    3:37 PM 11/06/2023    4:03  PM 10/07/2023    2:44 PM 12/26/2022    3:34 PM  GAD 7 : Generalized Anxiety Score  Nervous, Anxious, on Edge 0 0 2 0  Control/stop worrying 1 0 1 1  Worry too much - different things 1 0 1 2  Trouble relaxing 1 1 1 1   Restless 2 1 2  0  Easily annoyed or irritable 3 3 3 3   Afraid - awful might happen 1 0 1 3  Total GAD 7 Score 9 5 11 10      Review of Systems:   Pertinent items are noted in HPI Denies any headaches, blurred vision, fatigue, shortness of breath, chest pain, abdominal pain, abnormal vaginal discharge/itching/odor/irritation, problems with periods, bowel movements, urination, or intercourse unless otherwise stated above. Pertinent History Reviewed:  Reviewed past medical,surgical, social and family history.  Reviewed problem list, medications and allergies. Physical Assessment:   Vitals:   06/29/24 1537  BP: 113/81  Pulse: (!) 106  Weight: 250 lb 12.8 oz (113.8 kg)  Height: 5' 1.5 (1.562 m)  Body mass index is 46.62 kg/m.        Physical Examination:   General  appearance - well appearing, and in no distress  Mental status - alert, oriented   Psych:  She has a normal mood and affect  Skin - warm and dry, normal color  Chest - effort normal  Neck:  midline trachea, no thyromegaly or nodules  Breasts - breasts appear normal, no suspicious masses, no skin or nipple changes or  axillary nodes  Abdomen - soft, nontender  Pelvic - VULVA: normal appearing vulva with no masses, tenderness or lesions  VAGINA: normal appearing vagina with normal color and discharge, no lesions  CERVIX: normal appearing cervix without discharge or lesions, no CMT  Thin prep pap is done w HR HPV cotesting  Extremities:  No  varicosities noted, bilateral lower extremity edema noted  Chaperone present for exam  IUD Insertion Procedure Note Patient identified, informed consent performed, consent signed.   Discussed risks of irregular bleeding, cramping, infection, malpositioning or  misplacement of the IUD outside the uterus which may require further procedure such as laparoscopy. Time out was performed.  Urine pregnancy test negative.  Speculum placed in the vagina.  Cervix visualized.  Cleaned with Betadine  x 2.  Grasped anteriorly with a single tooth tenaculum.  Uterus sounded to 7 cm.  Mirena IUD placed per manufacturer's recommendations.  Strings trimmed to 3 cm. Tenaculum was removed, good hemostasis noted.  Patient tolerated procedure well.   Patient was given post-procedure instructions.   Patient was also asked to check IUD strings periodically and follow up in 4 weeks for IUD check.   Results for orders placed or performed in visit on 06/29/24 (from the past 24 hours)  POCT urine pregnancy   Collection Time: 06/29/24  3:53 PM  Result Value Ref Range   Preg Test, Ur Negative Negative    Assessment & Plan:  1. Encounter for annual routine gynecological examination (Primary) Pap today Encourage self breast exam, mammogram at 40  2. Cervical cancer screening  - Cytology - PAP  3. Screening for STD (sexually transmitted disease)  - Cytology - PAP  4. Encounter for insertion of Mirena IUD Discussed risk of irregular bleeding first 3-6 months Will return in 4 weeks  - POCT urine pregnancy - levonorgestrel (MIRENA) 20 MCG/DAY IUD 1 each  5. Anxiety and depression Discussed adding bupropion with lexapro , will reassess in a few weeks   - buPROPion (WELLBUTRIN XL) 150 MG 24 hr tablet; Take 1 tablet (150 mg total) by mouth daily.  Dispense: 30 tablet; Refill: 3  6. Localized edema Was taking hydrochlorothiazide  previously for edema,  - furosemide (LASIX) 20 MG tablet; Take 1 tablet (20 mg total) by mouth 2 (two) times daily for 5 days.  Dispense: 10 tablet; Refill: 0   Labs/procedures today:   Mammogram: @ 33yo, or sooner if problems Colonoscopy: @ 33yo, or sooner if problems  Orders Placed This Encounter  Procedures   POCT urine pregnancy     Meds:  Meds ordered this encounter  Medications   levonorgestrel (MIRENA) 20 MCG/DAY IUD 1 each   buPROPion (WELLBUTRIN XL) 150 MG 24 hr tablet    Sig: Take 1 tablet (150 mg total) by mouth daily.    Dispense:  30 tablet    Refill:  3   furosemide (LASIX) 20 MG tablet    Sig: Take 1 tablet (20 mg total) by mouth 2 (two) times daily for 5 days.    Dispense:  10 tablet    Refill:  0    Follow-up: Return in about 4 weeks (  around 07/27/2024), or IUD string check.   Nidia Daring, FNP

## 2024-07-02 ENCOUNTER — Ambulatory Visit: Payer: Self-pay | Admitting: Obstetrics and Gynecology

## 2024-07-02 LAB — CYTOLOGY - PAP
Chlamydia: NEGATIVE
Comment: NEGATIVE
Comment: NEGATIVE
Comment: NORMAL
Diagnosis: NEGATIVE
Diagnosis: REACTIVE
High risk HPV: NEGATIVE
Neisseria Gonorrhea: NEGATIVE

## 2024-07-07 ENCOUNTER — Telehealth: Payer: Self-pay | Admitting: Obstetrics and Gynecology

## 2024-07-07 NOTE — Telephone Encounter (Signed)
 Telephone call to discuss bleeding and cramping with IUD, discussed expected bleeding. Trial ibuprofen  and/or tylenol . Let me know if cramping or bleeding worsens.  Discussed irritability with medication, feels she managing ok, just noticing a difference. Discussed switching meds completely to Effexor vs staying on lexapro  and wellbutrin. Also has a lot going on at work right now. Will give it a little longer and let me know next week if desires switch.

## 2024-07-26 ENCOUNTER — Other Ambulatory Visit: Payer: Self-pay | Admitting: Obstetrics and Gynecology

## 2024-07-26 DIAGNOSIS — F419 Anxiety disorder, unspecified: Secondary | ICD-10-CM

## 2024-07-26 MED ORDER — VENLAFAXINE HCL ER 37.5 MG PO CP24
37.5000 mg | ORAL_CAPSULE | Freq: Every day | ORAL | 11 refills | Status: AC
Start: 2024-07-26 — End: 2025-07-26

## 2024-08-06 ENCOUNTER — Ambulatory Visit: Admitting: Obstetrics and Gynecology

## 2024-08-06 ENCOUNTER — Encounter: Payer: Self-pay | Admitting: Obstetrics and Gynecology

## 2024-08-06 VITALS — BP 118/82 | HR 98 | Ht 61.2 in | Wt 254.0 lb

## 2024-08-06 DIAGNOSIS — Z30431 Encounter for routine checking of intrauterine contraceptive device: Secondary | ICD-10-CM

## 2024-08-06 DIAGNOSIS — F32A Depression, unspecified: Secondary | ICD-10-CM

## 2024-08-06 NOTE — Progress Notes (Unsigned)
   GYNECOLOGY PROGRESS NOTE  History:  33 y.o. [id number] presents to [facility] for IUD check. Doing well with IUD, had some irregular bleeding with initiation, otherwise doing well.   Started effexor  11/26 has not noticed changed yet  The following portions of the patient's history were reviewed and updated as appropriate: allergies, current medications, past family history, past medical history, past social history, past surgical history and problem list. Last pap smear on 1-0/28/25 was normal, neg HRHPV.  Health Maintenance Due  Topic Date Due   Hepatitis C Screening  Never done   DTaP/Tdap/Td (1 - Tdap) Never done   Hepatitis B Vaccines 19-59 Average Risk (1 of 3 - 19+ 3-dose series) Never done   HPV VACCINES (1 - 3-dose SCDM series) Never done   Influenza Vaccine  Never done   COVID-19 Vaccine (1 - 2025-26 season) Never done     Review of Systems:  Pertinent items are noted in HPI.   Objective:  Physical Exam Blood pressure 118/82, pulse 98, height 5' 1.2 (1.554 m), weight 254 lb (115.2 kg), last menstrual period 07/31/2024. VS reviewed, nursing note reviewed,  Constitutional: well developed, well nourished, no distress Pulm/chest wall: normal effort Neuro: alert and oriented  Skin: warm, dry Psych: affect appropriate  Pelvic exam: Cervix pink, visually closed, without lesion, scant blood noted, vaginal walls and external genitalia normal, IUD strings visualized    Assessment & Plan:  1. IUD check up (Primary) Continue with IUD until 2033 unless desires removal sooner  2. Anxiety and depression Plan to give effexor  longer, discussed stablishing with [facility] also planning to establish with in person counselor. Will follow up to reassess   Return follow up mood check with Kamica Florance   Future Appointments  Date Time Provider Department Center  09/24/2024  8:50 AM Delores Nidia CROME, FNP [facility]     Nidia Delores, FNP 11:16 AM

## 2024-09-24 ENCOUNTER — Ambulatory Visit: Admitting: Obstetrics and Gynecology

## 2024-10-22 ENCOUNTER — Ambulatory Visit: Admitting: Obstetrics and Gynecology
